# Patient Record
Sex: Female | Born: 1960 | Race: White | Hispanic: No
Health system: Midwestern US, Academic
[De-identification: ages and names within clinical notes are randomized; demographics above are authoritative.]

---

## 2021-07-21 IMAGING — CR SPLUMBLM
3 series · 3 of 3 positions shown · non-contrast
Comparison: none

[lspine lat]
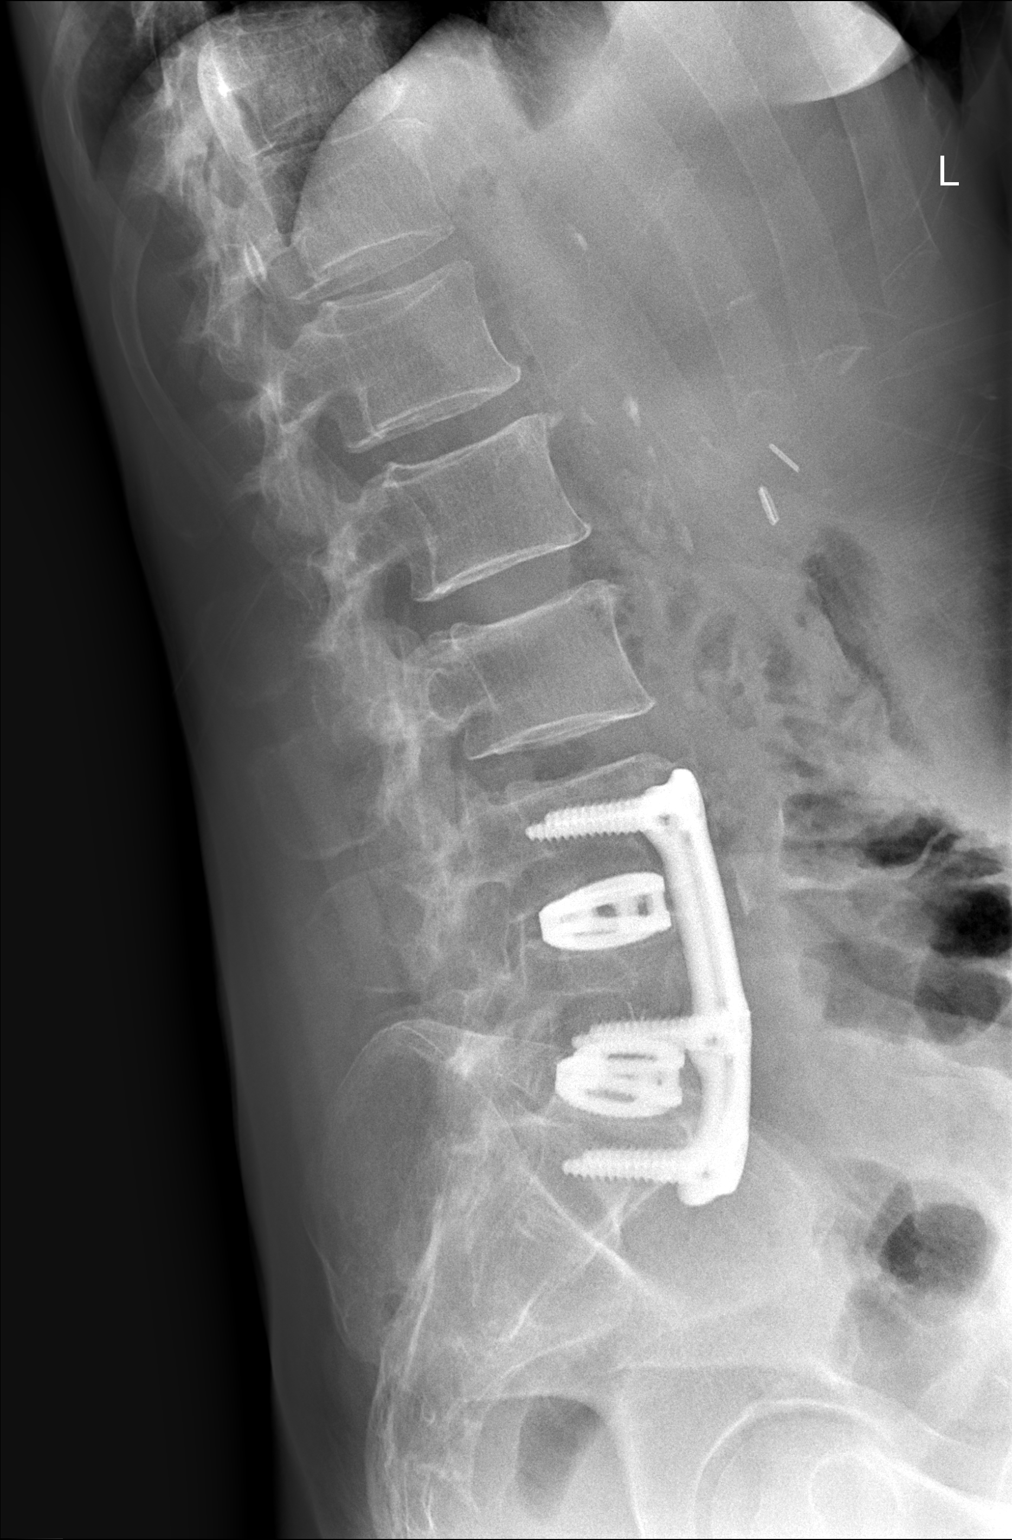

[lspine l5-s1]
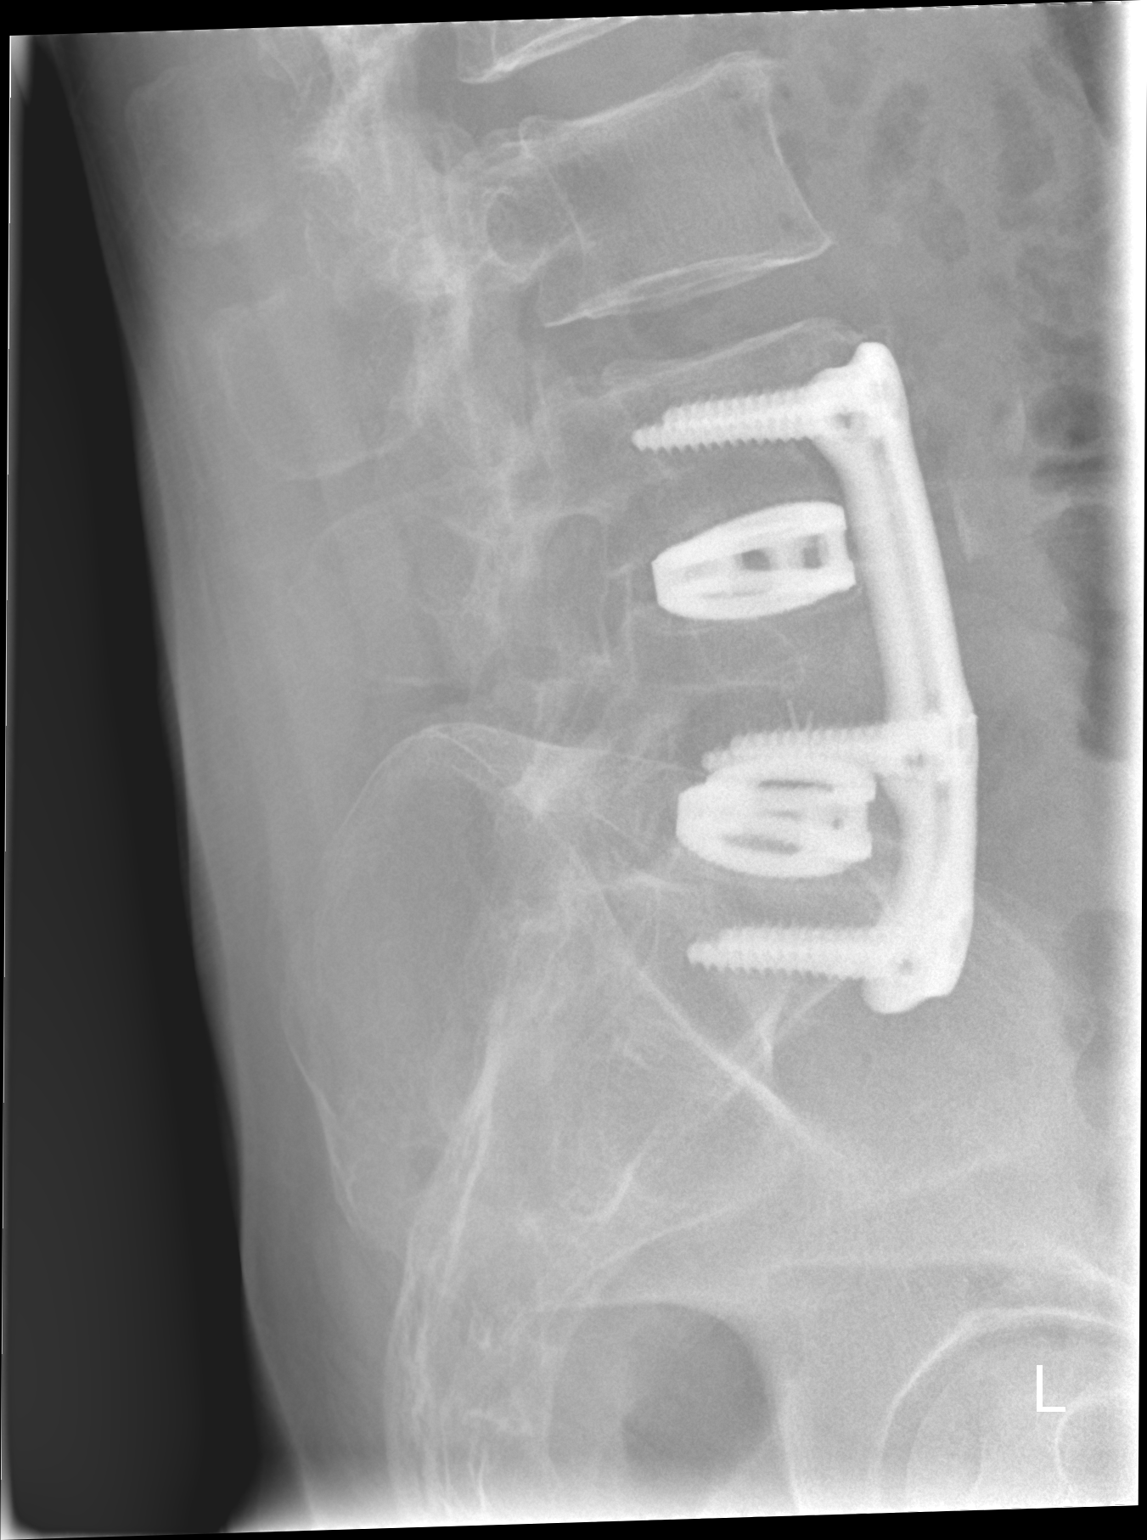

[lspine ap]
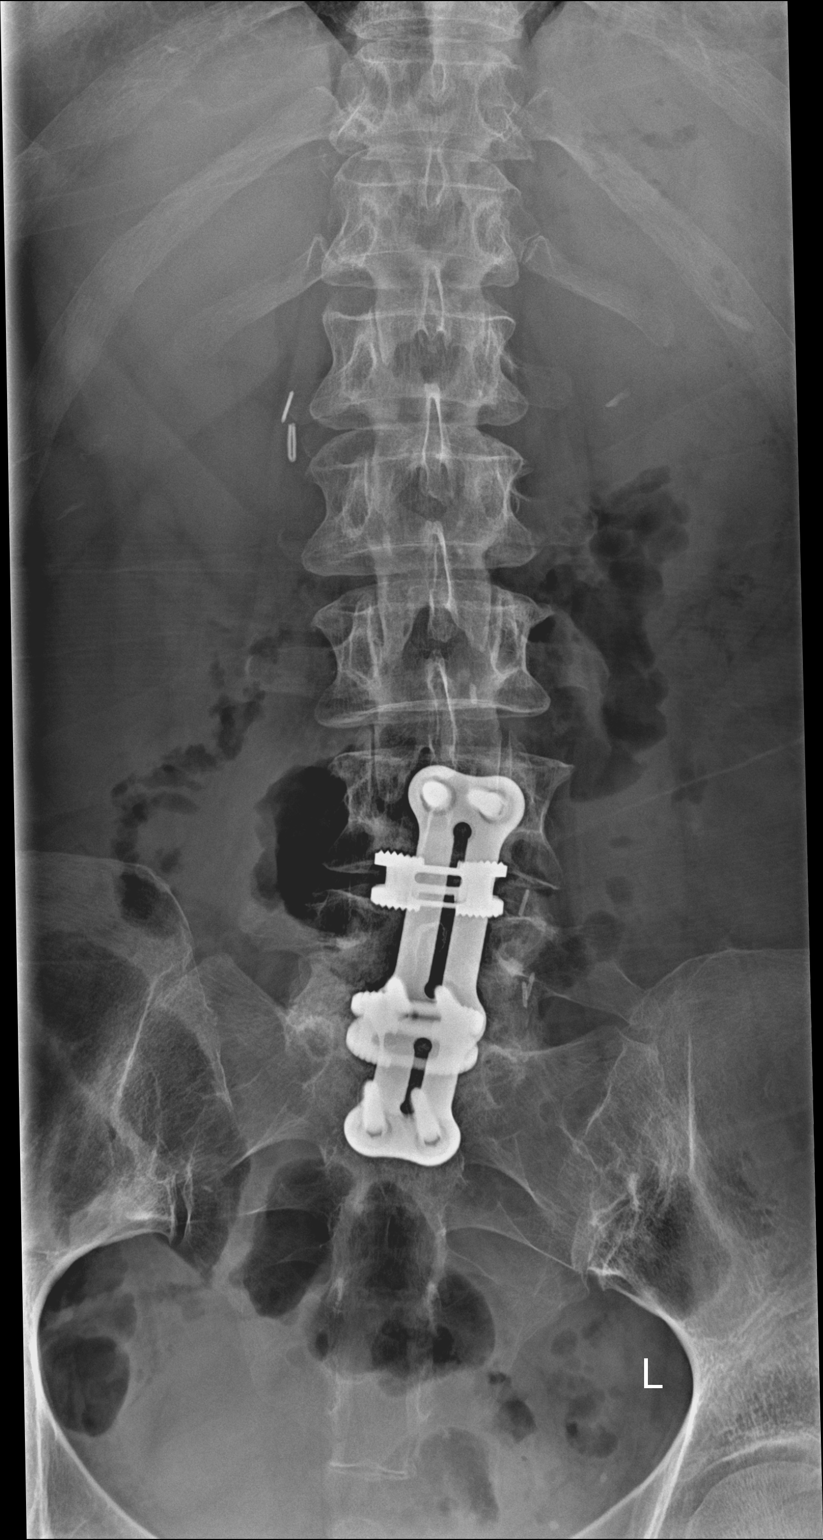

[3 of 3 positions shown; findings below may reference images not displayed]

EXAM

XR lumbar spine 2-3V

INDICATION

exacerbation of chronic back pain
Chronic lower back pain. Hx of back surgery.

TECHNIQUE

Three views of the lumbar spine

COMPARISONS

None available at the time of dictation.

FINDINGS

Anterior fusion hardware placement from L3-S1 with intervertebral disc spacers. Diffusely decreased
bone mineralization. No osseous lucency along the hardware bone interfaces. No perihardware fracture
or dislocation. Suspicion for relatively decreased bony foraminal narrowing at L3-4, L4-5, L5-S1.
Surgical clips in the abdomen.

No endplate compression fracture.

IMPRESSION
1. Postoperative changes as detailed above.
2. Bony foraminal narrowing at L3-4, L4-5, L5-S1.

Tech Notes:

Chronic lower back pain. Hx of back surgery.

## 2021-07-22 ENCOUNTER — Encounter: Admit: 2021-07-22 | Discharge: 2021-07-22

## 2021-07-22 ENCOUNTER — Ambulatory Visit: Admit: 2021-07-22 | Discharge: 2021-07-22

## 2021-07-22 DIAGNOSIS — L0291 Cutaneous abscess, unspecified: Secondary | ICD-10-CM

## 2021-07-22 DIAGNOSIS — M545 Chronic low back pain, unspecified back pain laterality, unspecified whether sciatica present: Secondary | ICD-10-CM

## 2021-07-22 DIAGNOSIS — L02619 Cutaneous abscess of unspecified foot: Secondary | ICD-10-CM

## 2021-07-22 DIAGNOSIS — M549 Dorsalgia, unspecified: Secondary | ICD-10-CM

## 2021-07-22 DIAGNOSIS — A419 Sepsis, unspecified organism: Secondary | ICD-10-CM

## 2021-07-22 IMAGING — CT FOOTLTWW
4 of 8 series · 12 of 33 positions shown, 13 images · non-contrast
Comparison: none

[Series 502: foot or ankle ax 2.00 br60 s3 foot · axial · 0.26mm/px · z∈[+853,+914]mm · 2 of 106 slices shown, 3 images]
[im 36/106  soft-tissue]
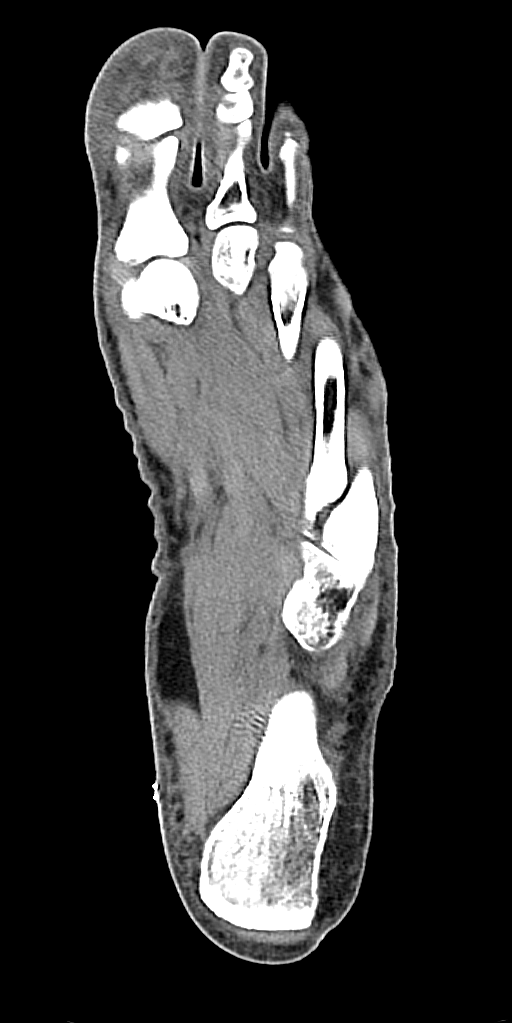
[im 36/106  bone]
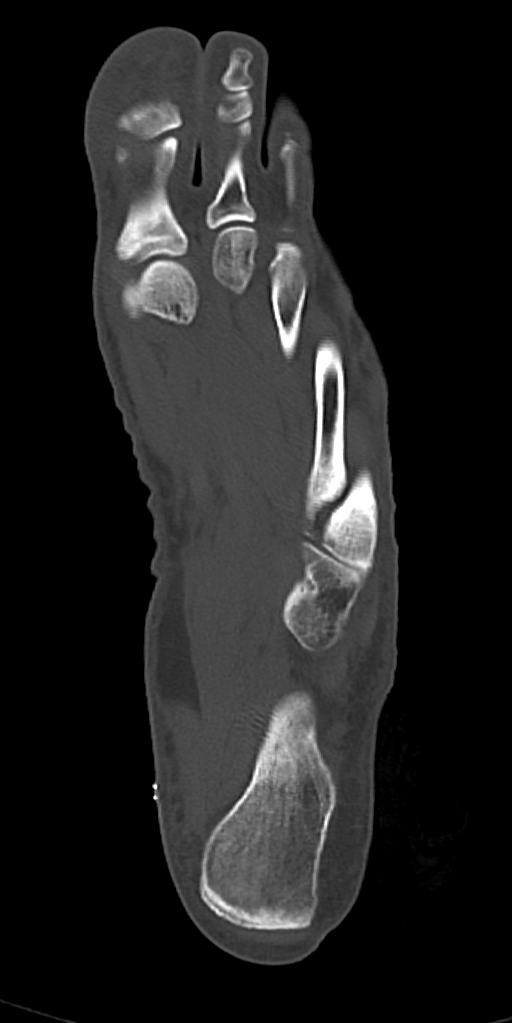
[im 71/106  bone]
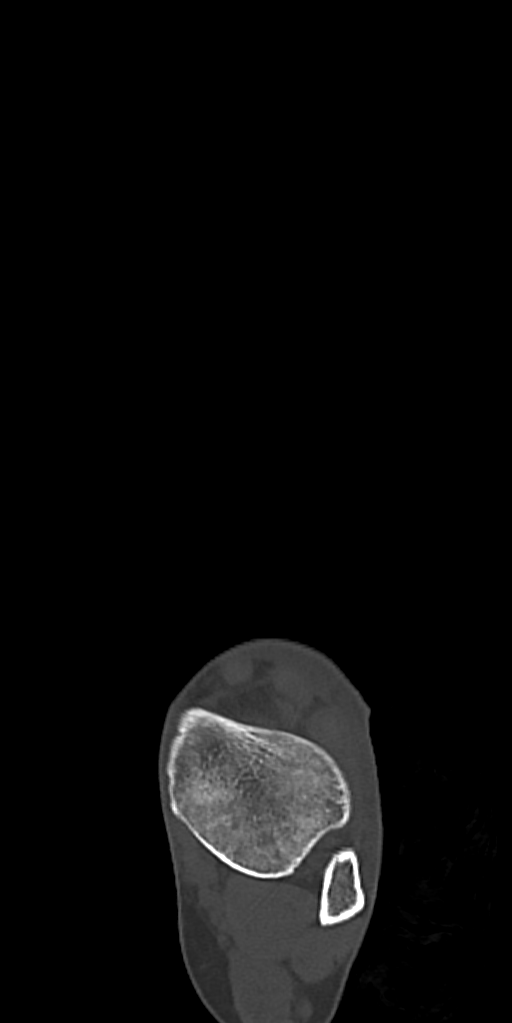

[Series 503: foot or ankle cor 2.00 br60 s3 foot · coronal · 0.26mm/px · 3 of 130 slices shown]
[im 16/130  bone]
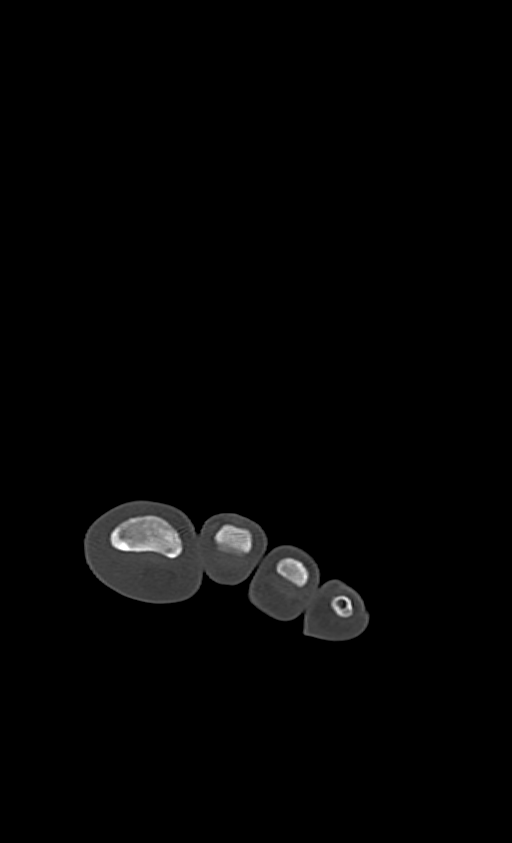
[im 42/130  bone]
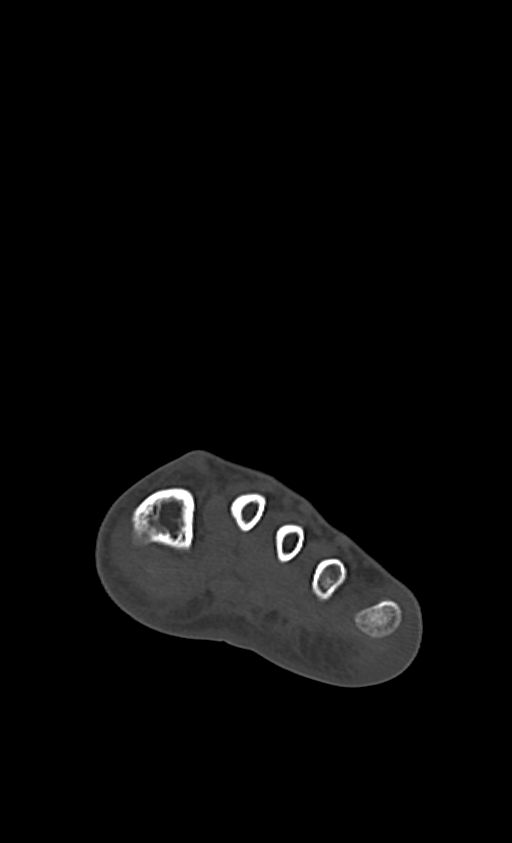
[im 68/130  bone]
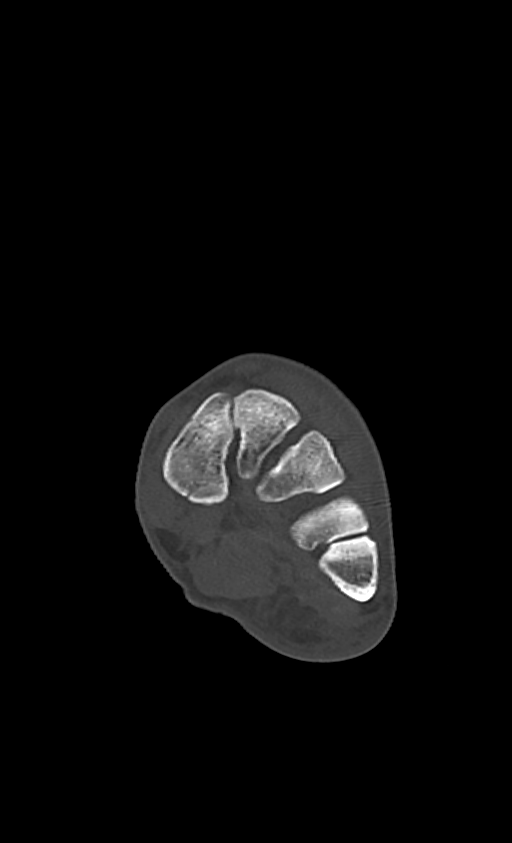

[Series 504: foot or ankle sag 2.00 br60 s3 foot · sagittal · 0.42mm/px · 5 of 65 slices shown]
[im 11/65  bone]
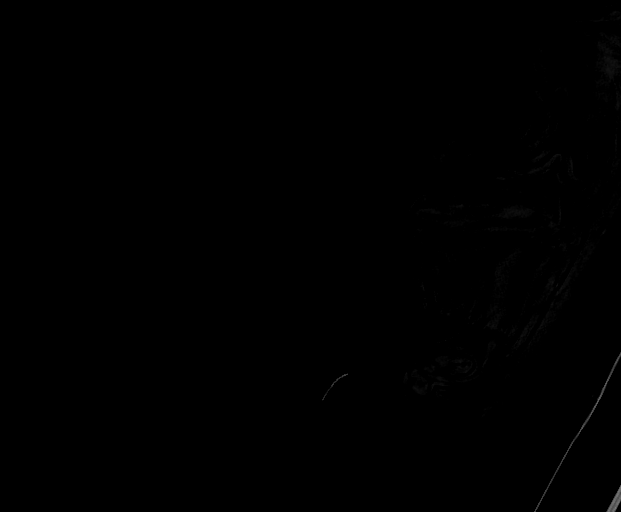
[im 22/65  bone]
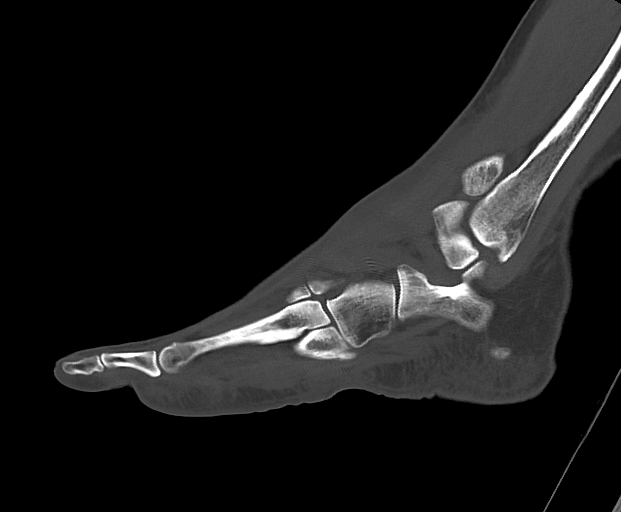
[im 33/65  bone]
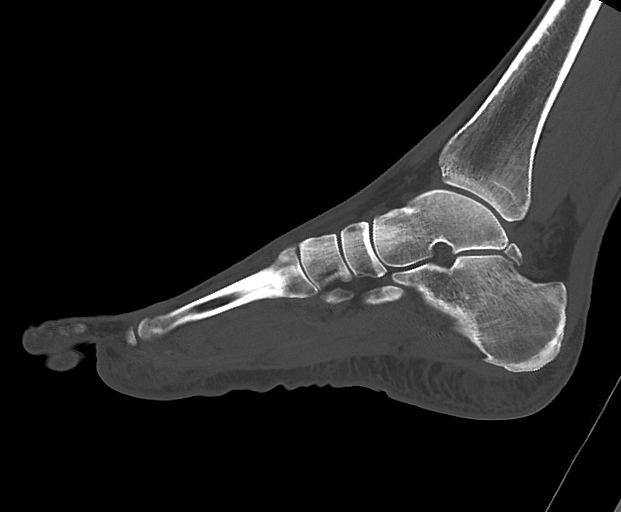
[im 43/65  bone]
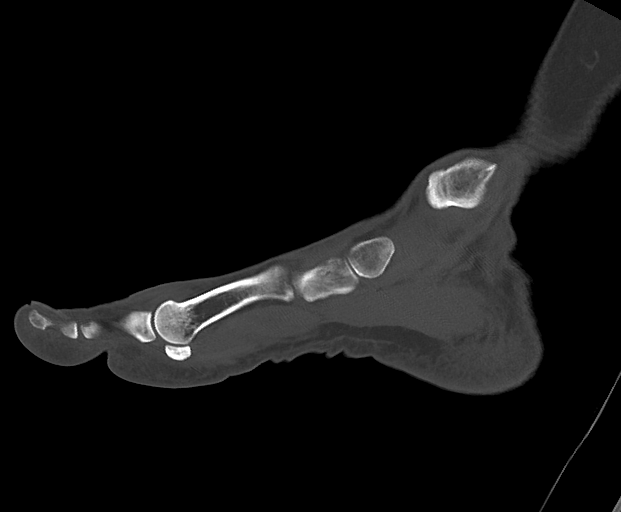
[im 54/65  bone]
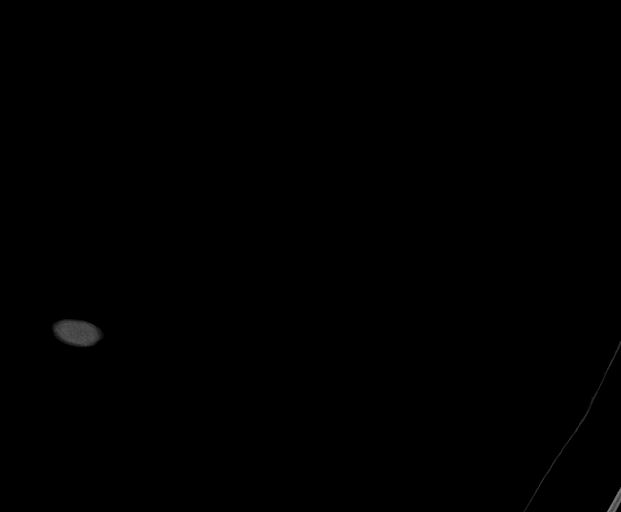

[Series 505: foot or ankle ax 2.00 br40 s3 foot · axial · 0.36mm/px · z∈[+820,+892]mm · 2 of 125 slices shown]
[im 42/125  bone]
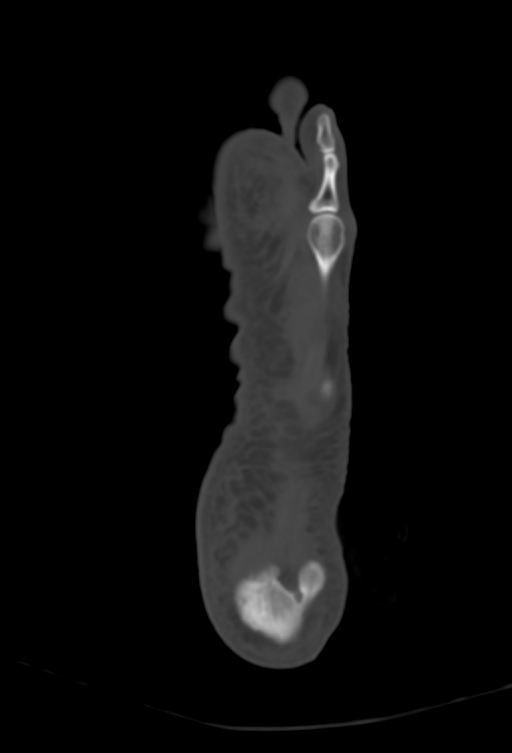
[im 83/125  bone]
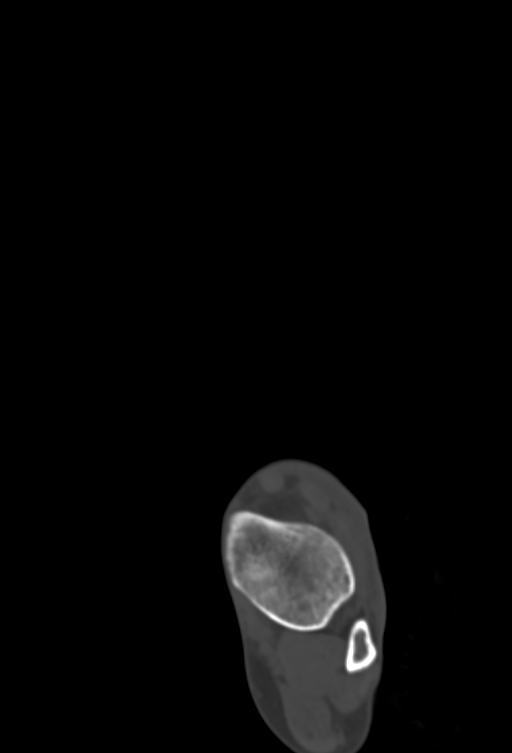

[12 of 33 positions shown; findings below may reference images not displayed]

DIAGNOSTIC STUDIES

EXAM

CT scan left foot and ankle with contrast.

INDICATION

abscess
RULE OUT ABSCESS. SWELLING IN LEFT FOOT AND ANKLE. GFR 83. 3VVB0 [id number]. CT/NM 0/0. TJ/CS

TECHNIQUE

All CT scans at this facility use dose modulation, iterative reconstruction, and/or weight based
dosing when appropriate to reduce radiation dose to as low as reasonably achievable.

Number of previous computed tomography exams in the last 12 months is 0  .

Number of previous nuclear medicine myocardial perfusion studies in the last 12 months is 0  .

COMPARISONS

None available

FINDINGS

There is an elongated septated rim enhancing fluid collection along the anterior lateral proximal
foot and/or distal ankle. This measures approximately 8 cm craniocaudad by 4.4 cm AP and 1 cm in
thickness image 38 series 510.

This lies along the course of the extensor digitorum longus and extends into portions of it
consistent with coexisting myositis.

The underlying osseous structures demonstrate no evidence for erosion or fracture.

IMPRESSION

Large rim enhancing septated fluid collection along the anterior lateral aspect of the proximal
left foot and/or distal ankle consistent with abscess and associated myositis of the extensor
digitorum longus. There is no evidence for osteomyelitis.

Tech Notes:

RULE OUT ABSCESS. SWELLING IN LEFT FOOT AND ANKLE. GFR 83. 3VVB0 [id number]. CT/NM 0/0. TJ/CS

## 2021-07-22 IMAGING — CT ANKLTWW
4 of 8 series · 11 of 34 positions shown, 12 images · non-contrast
Comparison: none

[Series 10: foot or ankle cor 2.00 br60 s3 ankle · coronal · 0.22mm/px · 1 of 69 slices shown]
[im 35/69  bone]
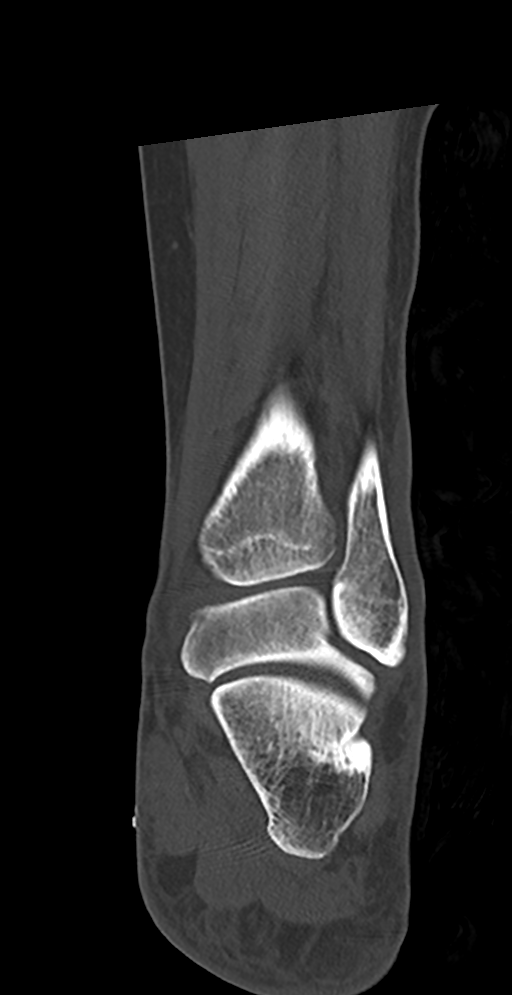

[Series 12: foot or ankle sag 2.00 br60 s3 ankle · sagittal · 0.27mm/px · 6 of 55 slices shown]
[im 10/55  bone]
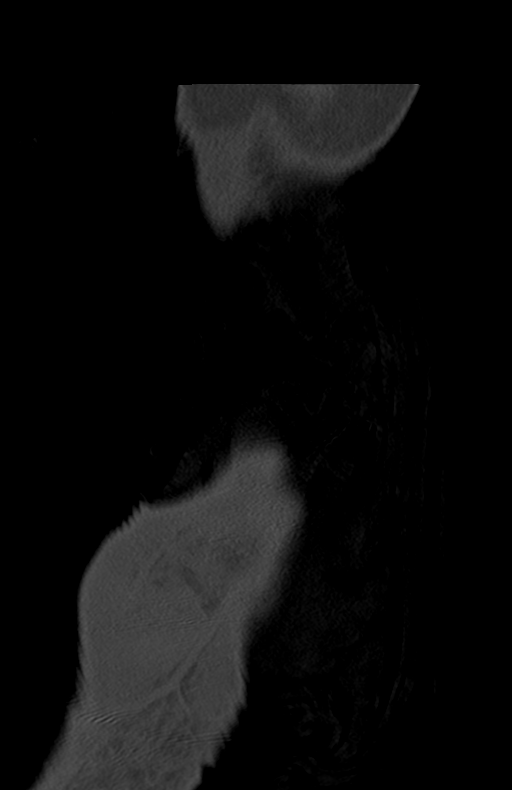
[im 19/55  bone]
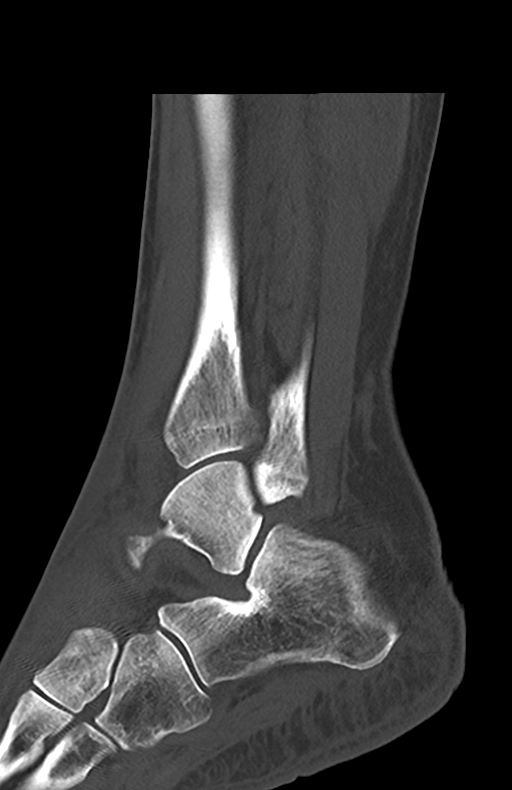
[im 28/55  bone]
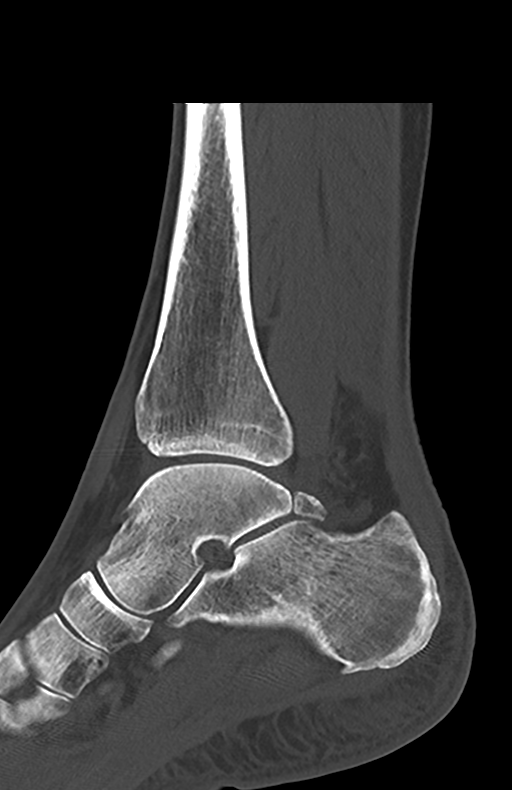
[im 33/55  soft-tissue]
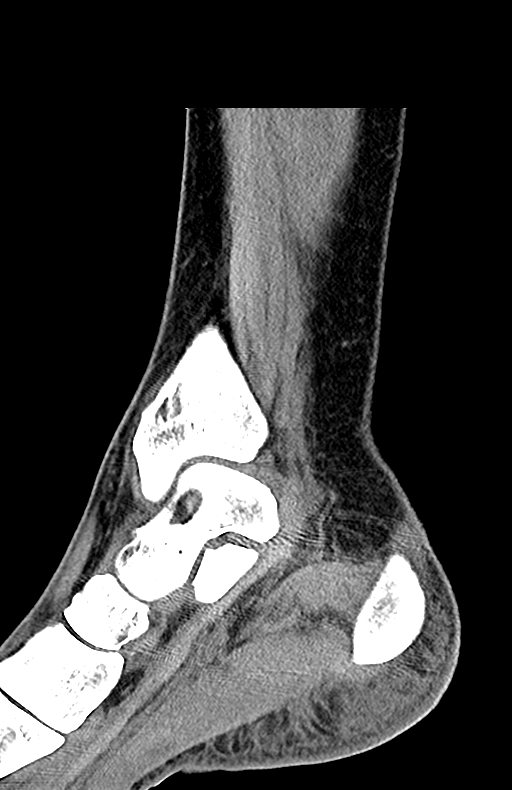
[im 37/55  bone]
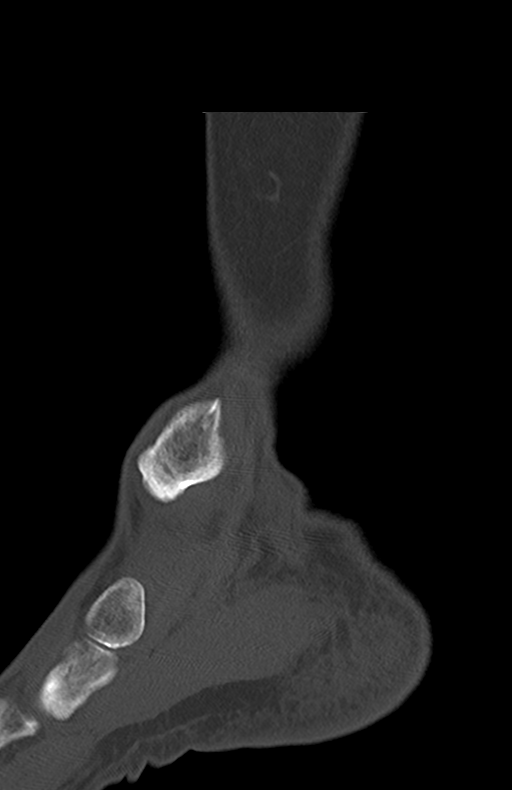
[im 46/55  bone]
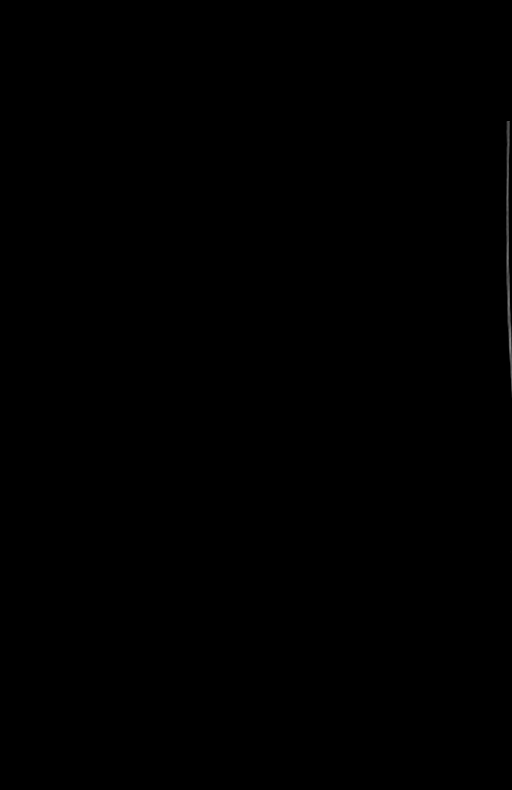

[Series 26: foot or ankle ax 2.00 br60 s3 ankle · axial · 0.21mm/px · z∈[+958,+1029]mm · 2 of 107 slices shown, 3 images]
[im 36/107  soft-tissue]
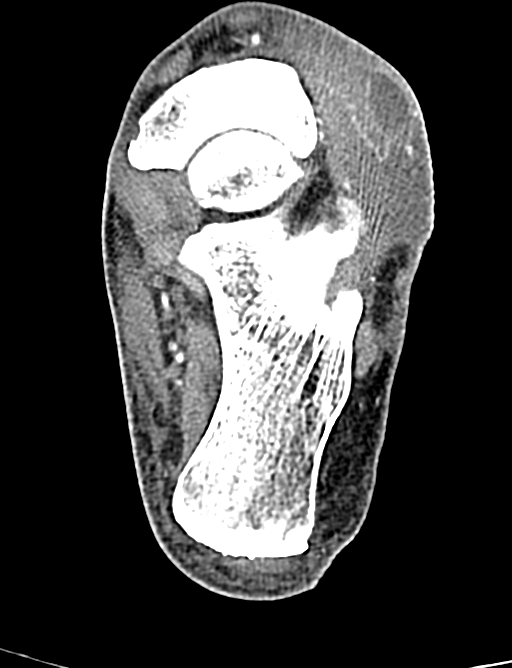
[im 36/107  bone]
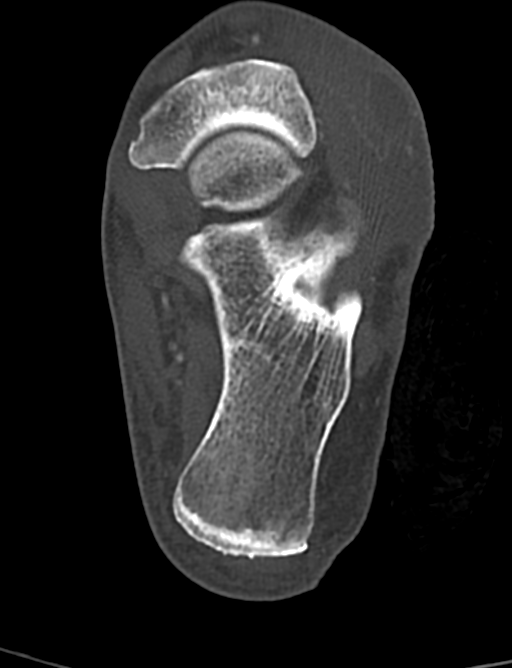
[im 71/107  bone]
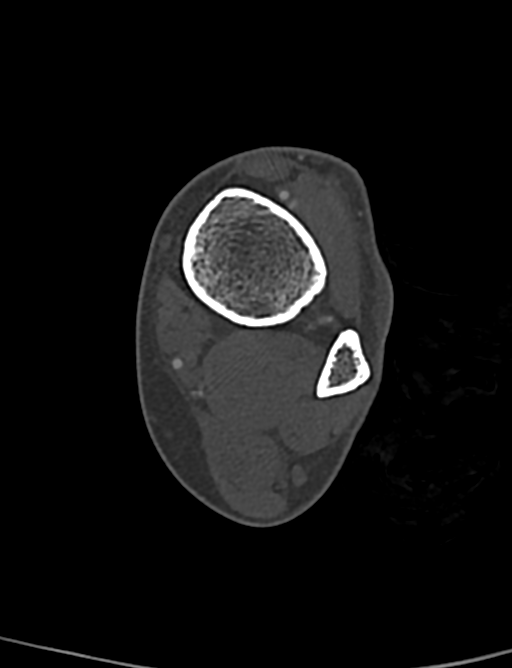

[Series 34: foot or ankle ax 2.00 br40 s3 ankle · axial · 0.21mm/px · z∈[+967,+1036]mm · 2 of 103 slices shown]
[im 35/103  bone]
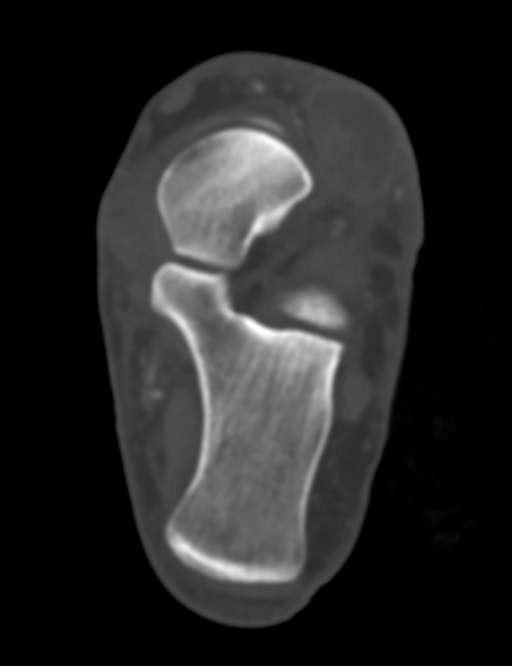
[im 69/103  bone]
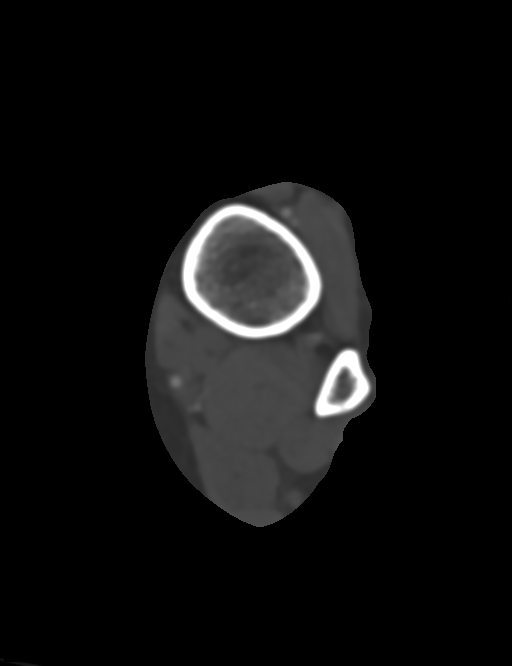

[11 of 34 positions shown; findings below may reference images not displayed]

DIAGNOSTIC STUDIES

EXAM

CT scan left foot and ankle with contrast.

INDICATION

abscess
RULE OUT ABSCESS. SWELLING IN LEFT FOOT AND ANKLE. GFR 83. 3VVB0 [id number]. CT/NM 0/0. TJ/CS

TECHNIQUE

All CT scans at this facility use dose modulation, iterative reconstruction, and/or weight based
dosing when appropriate to reduce radiation dose to as low as reasonably achievable.

Number of previous computed tomography exams in the last 12 months is 0  .

Number of previous nuclear medicine myocardial perfusion studies in the last 12 months is 0  .

COMPARISONS

None available

FINDINGS

There is an elongated septated rim enhancing fluid collection along the anterior lateral proximal
foot and/or distal ankle. This measures approximately 8 cm craniocaudad by 4.4 cm AP and 1 cm in
thickness image 38 series 510.

This lies along the course of the extensor digitorum longus and extends into portions of it
consistent with coexisting myositis.

The underlying osseous structures demonstrate no evidence for erosion or fracture.

IMPRESSION

Large rim enhancing septated fluid collection along the anterior lateral aspect of the proximal
left foot and/or distal ankle consistent with abscess and associated myositis of the extensor
digitorum longus. There is no evidence for osteomyelitis.

Tech Notes:

RULE OUT ABSCESS. SWELLING IN LEFT FOOT AND ANKLE. GFR 83. 3VVB0 [id number]. CT/NM 0/0. TJ/CS

## 2021-07-22 IMAGING — US ECHOCOMPL
1 series · 12 of 24 positions shown · non-contrast
Comparison: none

[Series 1: us echo 2d, complete · 20 acquisitions, 12 frames shown]
[im 1/20]
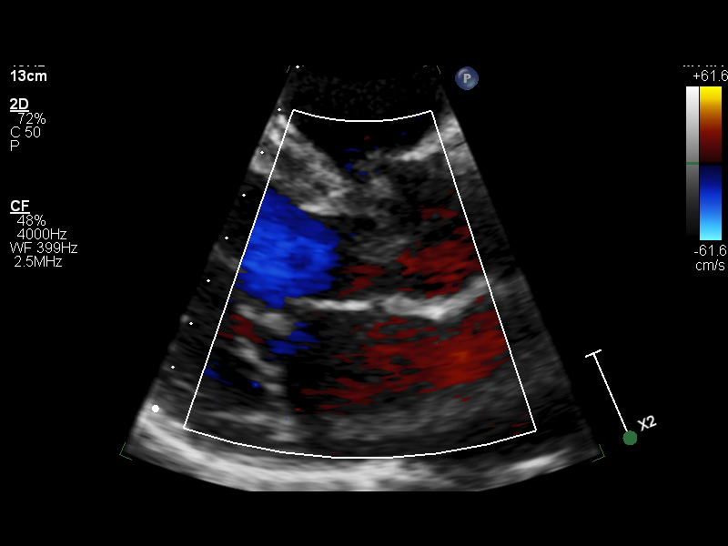
[im 4/20]
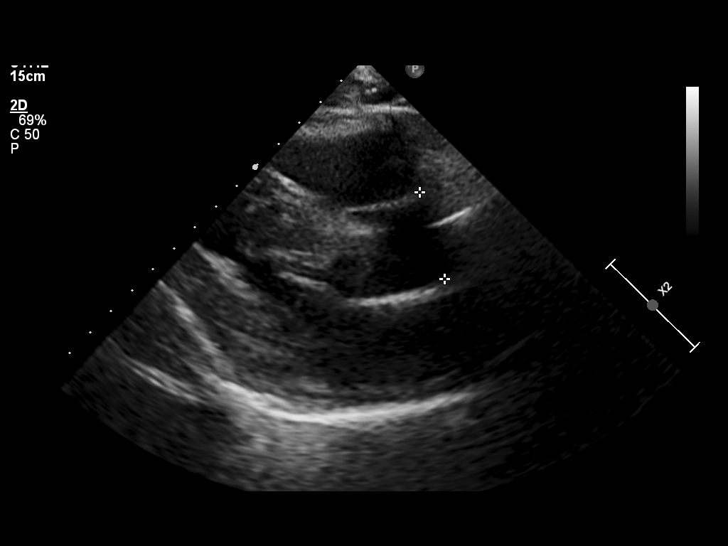
[im 5/20]
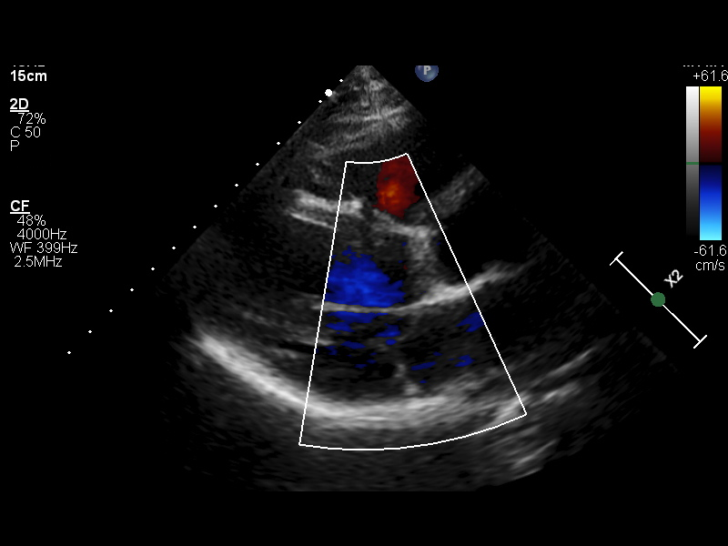
[im 7/20]
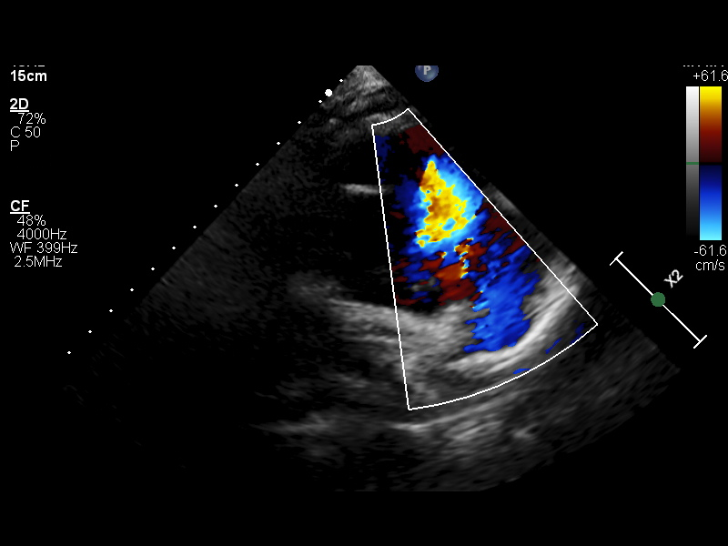
[im 8/20]
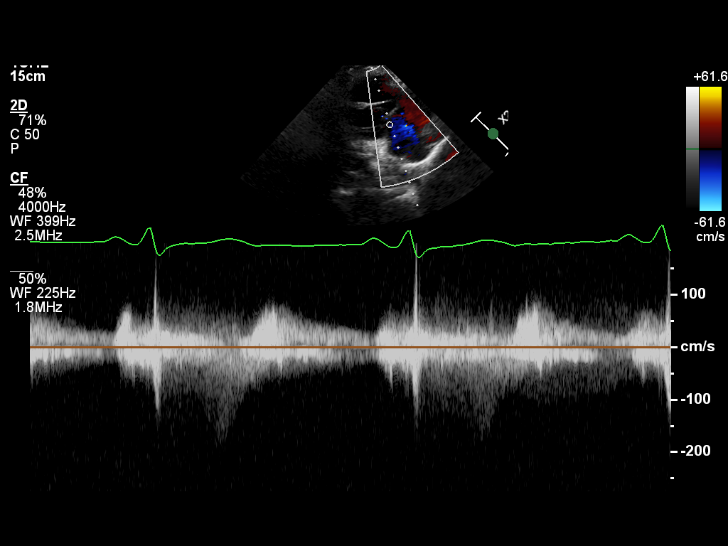
[im 9/20]
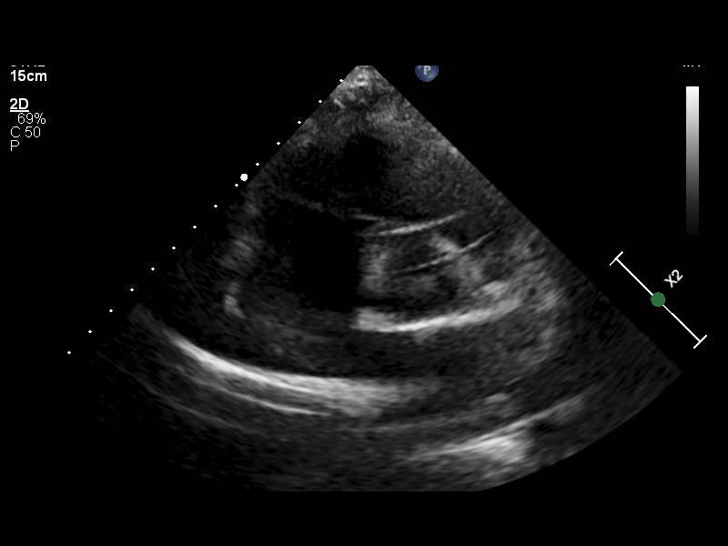
[im 11/20]
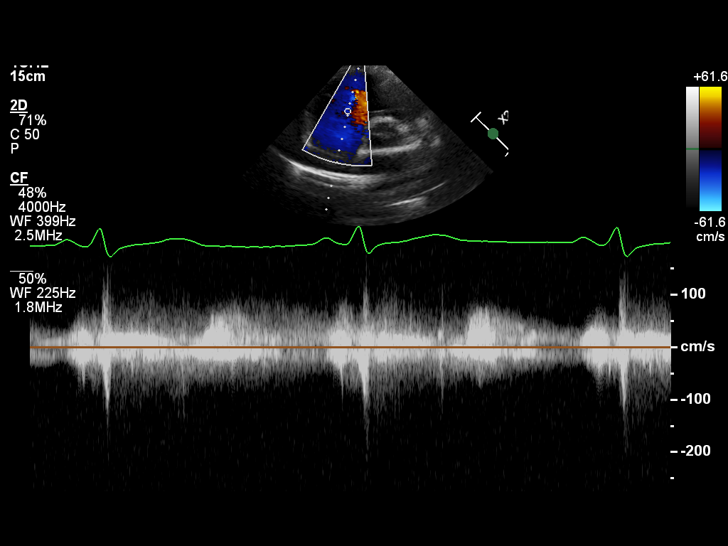
[im 12/20]
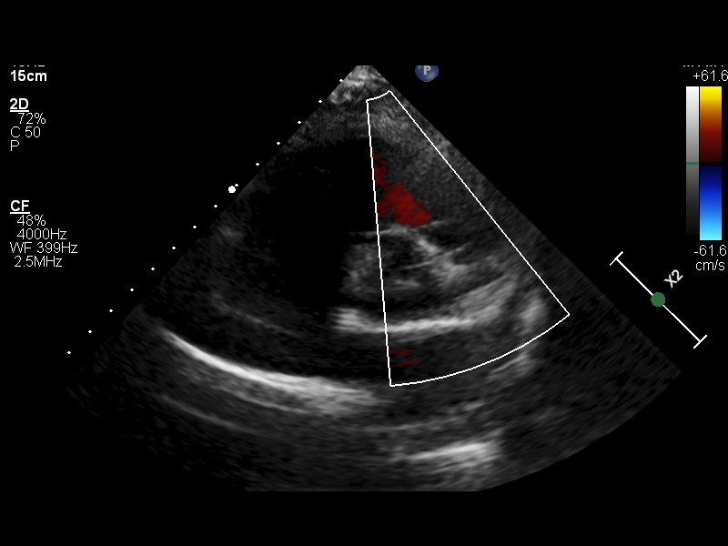
[im 15/20]
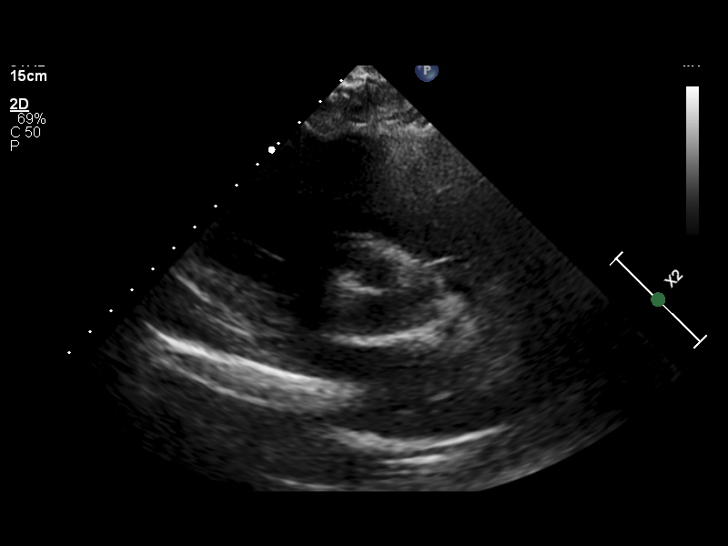
[im 16/20]
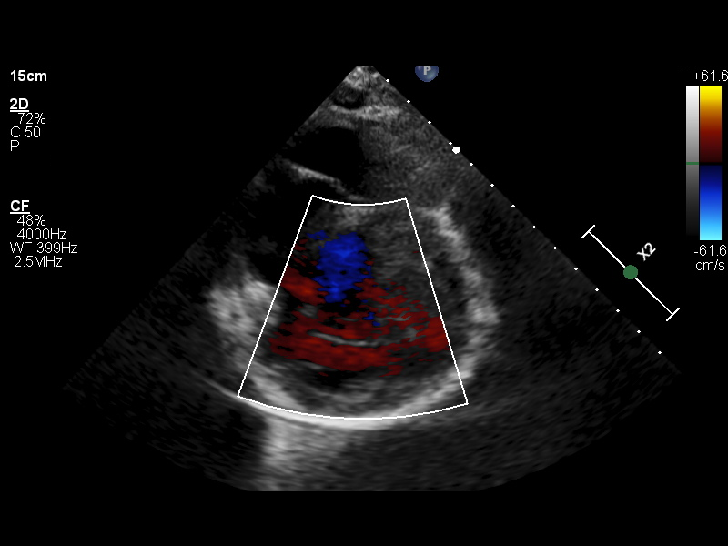
[im 18/20]
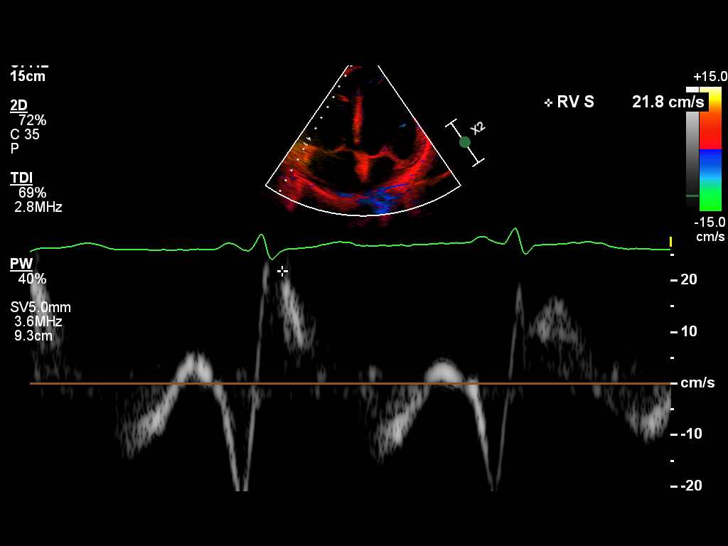
[im 20/20]
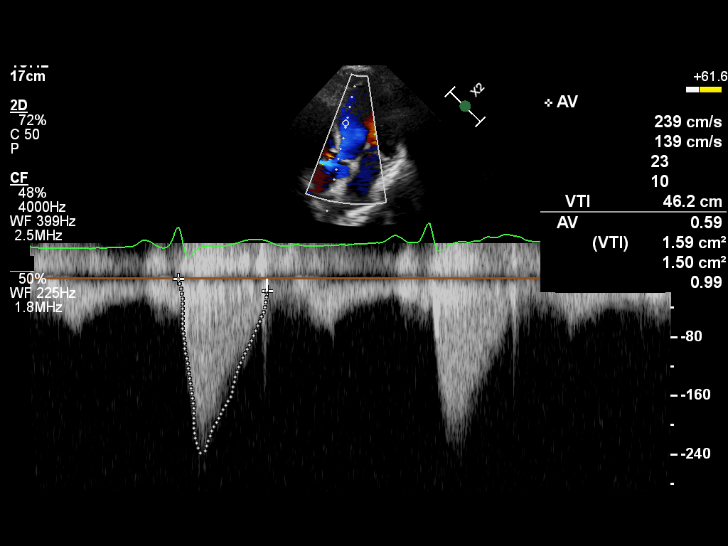

[12 of 24 positions shown; findings below may reference images not displayed]

Bodych, Artur Arczi; Lazo, Davinder

07/22/21 -  2D + DOPPLER ECHO
Location Performed: [HOSPITAL]

Referring Provider:
Interpreting Physician: Andujar, David
Frayre: Choi Domino, DO
Location of Interp:
Sonographer: External Staff

[facility]

Indications: Endocarditis          Suspected IVDU

Vitals
Height   Weight   BSA (Calculated)   BP   Comments
165.1 cm (5' 5")   56.2 kg (124 lb)   1.61   109/56

Interpretation Summary
Normal left ventricular size with mild left ventricular concentric hypertrophy.  Normal left
ventricular systolic function with an estimated ejection fraction of 60 to 65%.  No wall motion
abnormalities.  Normal diastolic function.
The right ventricle is mildly dilated with preserved systolic function.
Moderately dilated left atrium.  Normal right atrial size.
There is mild tricuspid regurgitation.  No other hemodynamically significant valvular abnormalities.
No clear evidence of endocarditis seen within the limitations of this study.  Consider further
imaging if clinically indicated.
Estimated central venous pressure is 0 to 5 mmHg.  Estimated pulmonary arterial systolic pressure is
No pericardial effusion

No prior studies available for comparison.

Echocardiographic Findings
Left Ventricle   The left ventricular size is normal. Mild concentric hypertrophy. The left
ventricular systolic function is normal. The ejection fraction by Simpson's biplane method is 64%.
Normal left ventricular diastolic function. Normal left atrial pressure.
Right Ventricle   The right ventricle is mildly dilated. The right ventricular systolic function is
normal.
Left Atrium   Moderately dilated.
Right Atrium   Normal size.
IVC/SVC   Normal central venous pressure (0-5 mm Hg).
Mitral Valve   Normal valve structure. No stenosis. Trace regurgitation.
Tricuspid Valve   Normal valve structure. No stenosis. Mild regurgitation.
Aortic Valve   The valve has focal thickening. No stenosis. No regurgitation.
Pulmonary   The pulmonic valve was not seen well but no Doppler evidence of stenosis.
Aorta   The aortic root and ascending aorta are normal in size.
Pericardium   No pericardial effusion.

Left Heart 2D Measurements (Normal Ranges)
EF (Simpson's)
64 %
LVIDD
5.0 cm  (Range: 3.8 - 5.2)
LVIDS
2.5 cm  (Range: 2.2 - 3.5)
IVS
1.0 cm  (Range: 0.6 - 0.9)
LV PW
1.1 cm  (Range: 0.6 - 0.9)
LA Size
3.5 cm  (Range: 2.7 - 3.8)

Right Heart 2D   M-Mode Measurements (Normal Ranges) (Range)
RV Basal Dia
4.2 cm  (2.5 - 4.1)
RV Mid Dia
3.4 cm  (1.9 - 3.5)
THOLL
14.0 cm2  (<18)
M-Mode TAPSE
3.4 cm  (>1.7)

Left Heart 2D Addnl Measurements (Normal Ranges)
LV Systolic Vol
39 mL  (Range: 14 - 42)
LV Systolic Vol Index
24 mL  (Range: 8 - 24)
LV Diastolic Vol
107 mL  (Range: 46 - 106)
LV Diastolic Vol Index
66 mL  (Range: 29 - 61)
LA Vol
68 mL  (Range: 22 - 52)
LA Vol Index
42.24  (Range: 16 - 34)
LV Mass
194 g  (Range: 67 - 162)
LV Mass Index
121 g/m2  (Range: 43 - 95)
RWT
0.44  (Range: <=0.42)

Aortic Root Measurements (Normal Ranges)
Sinus
3.4 cm  (Range: 2.4 - 3.6)
GERINDA MINDERHOUD
3.0 cm

Doppler (Spectral and Color Flow)
Estimated Peak Systolic PA Pressure
Aortic valve area
1.51 cm2
Aortic valve mean gradient
Aortic valve peak gradient
Aortic valve peak velocity
2.4 m/s
Aortic valve velocity ratio

Teaching Physician Attestation
Study interpreted in conjunction with cardiology fellow Dr. Chan Kuy Youmgsuk

Tech Notes:

## 2021-07-30 IMAGING — CR TOESRT
3 series · 3 of 3 positions shown · non-contrast
Comparison: none

[toes ap]
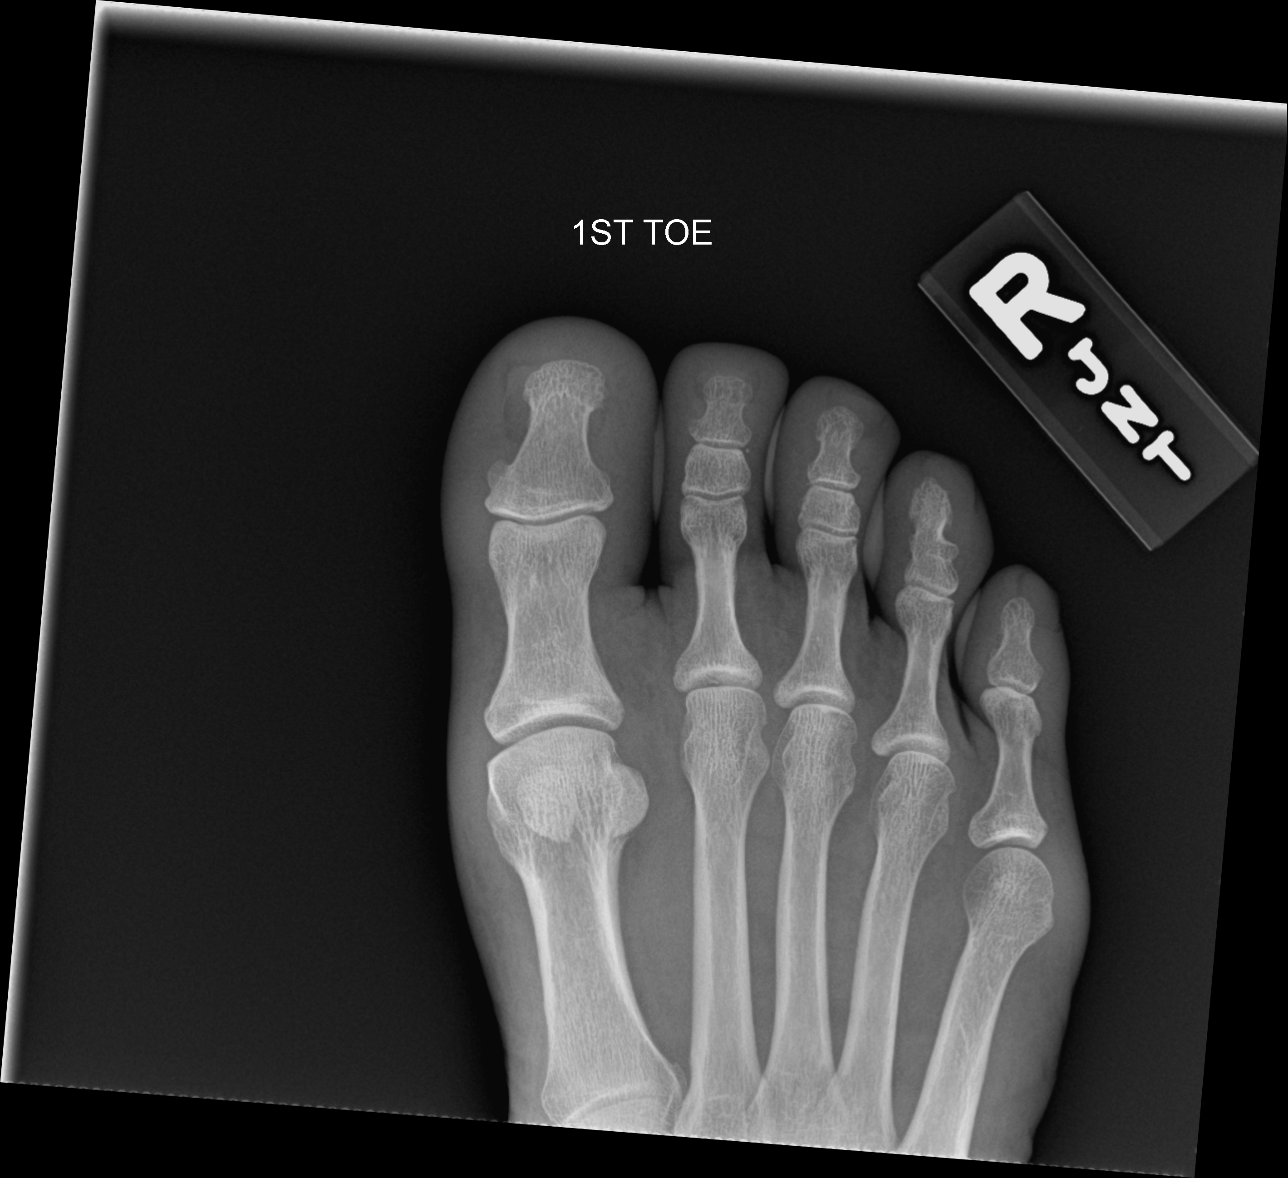

[toes lat]
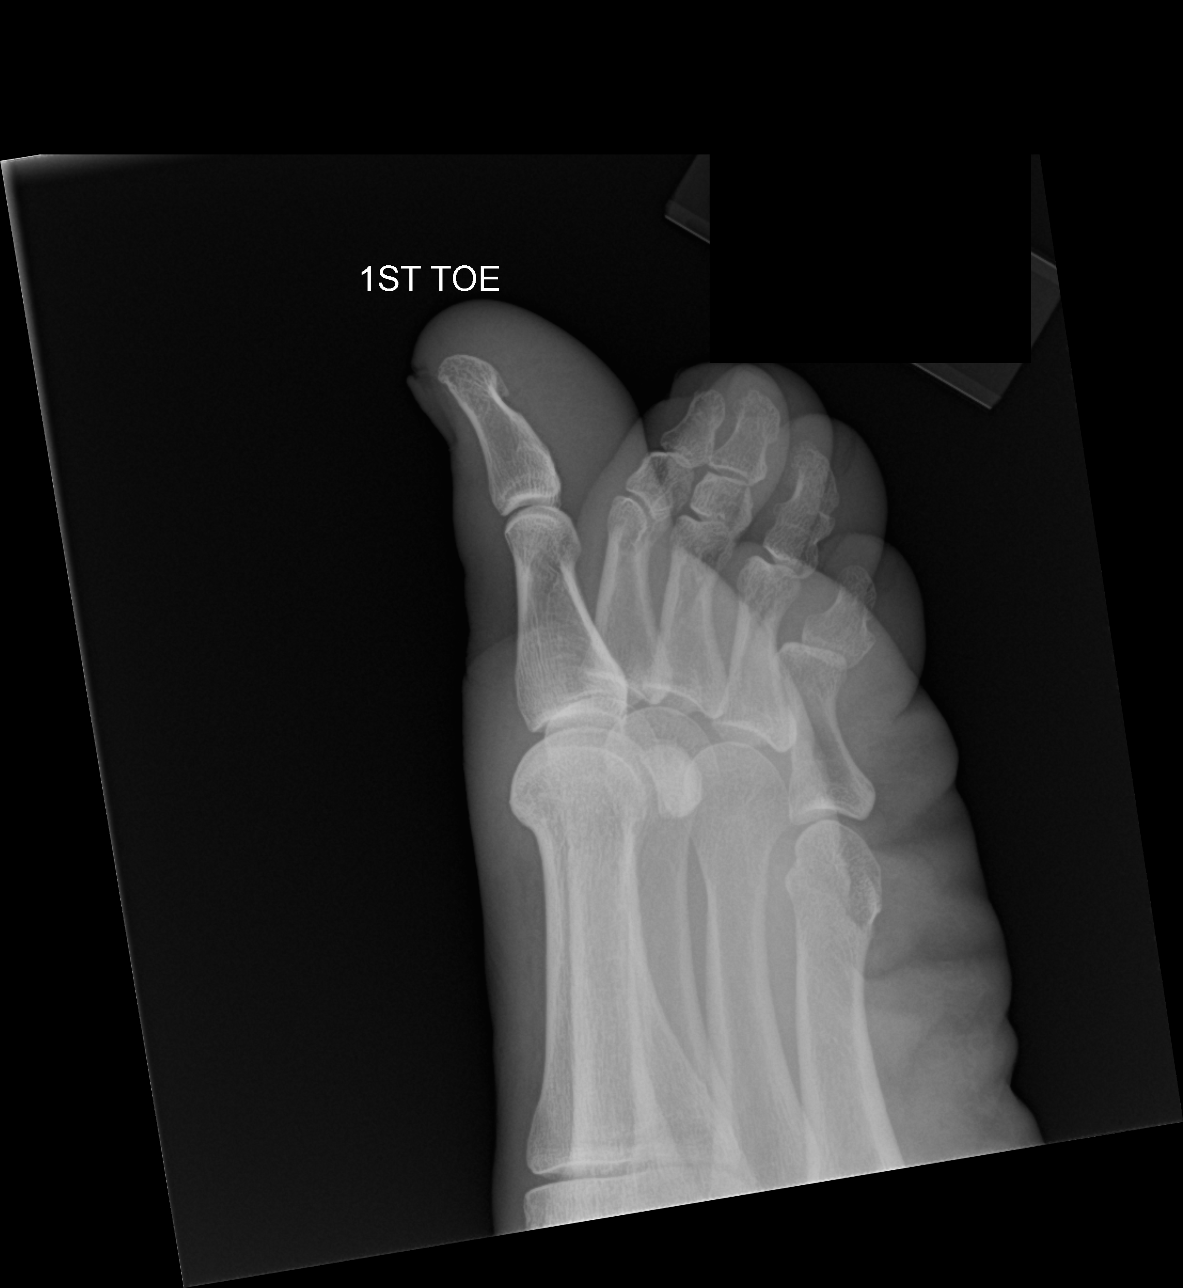

[toes obl]
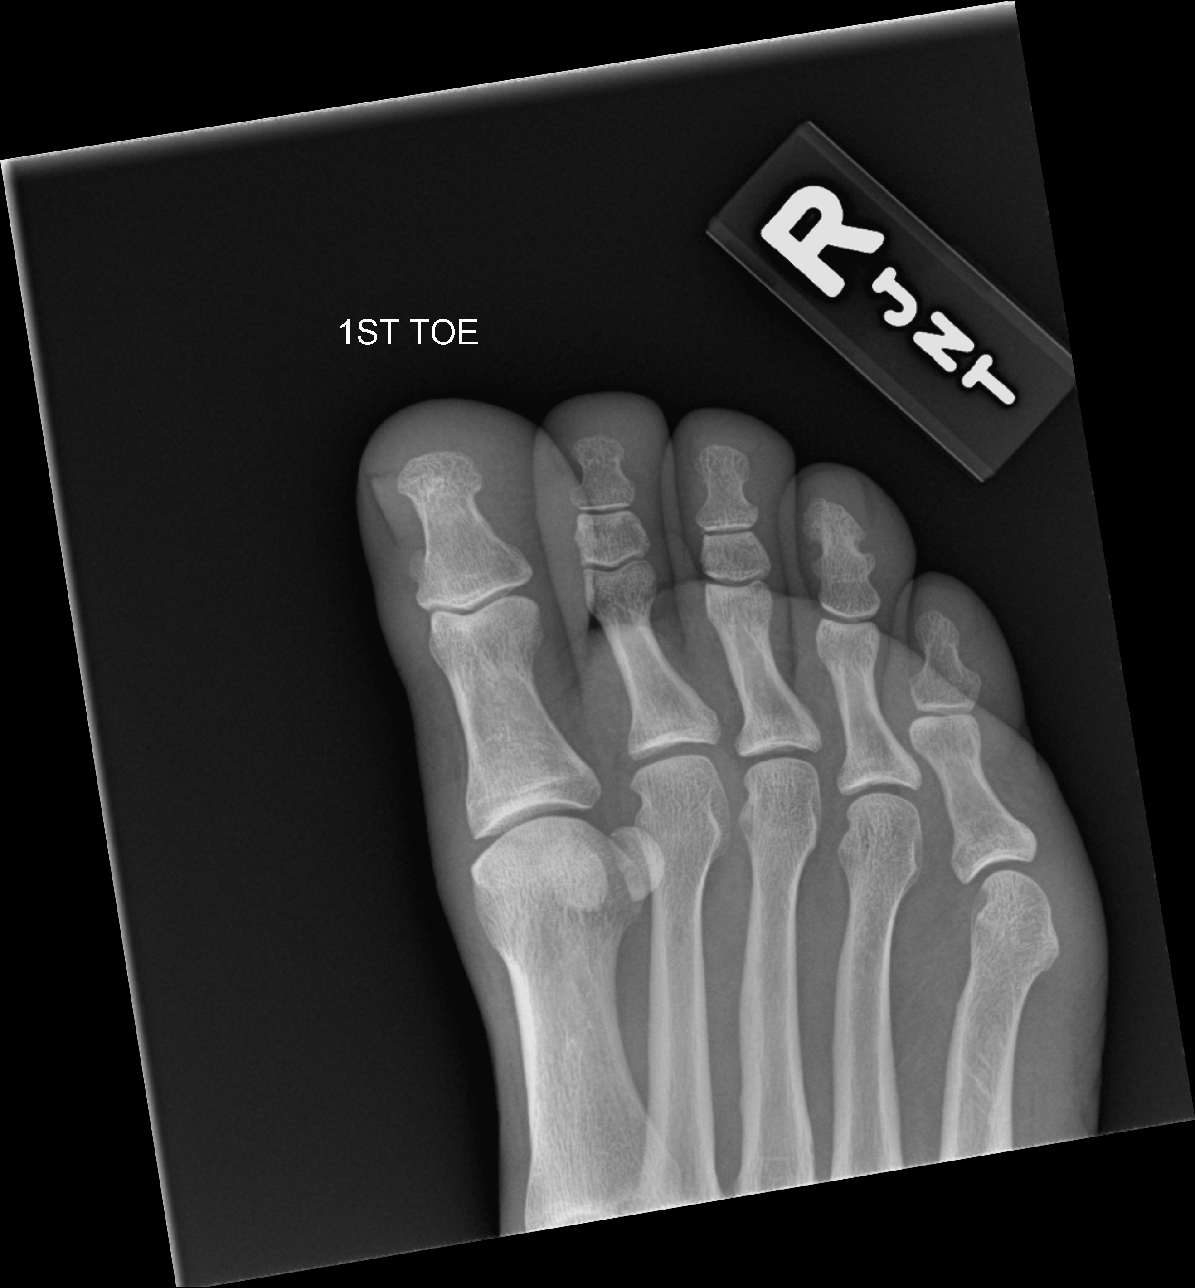

[3 of 3 positions shown; findings below may reference images not displayed]

DIAGNOSTIC STUDIES

EXAM

XR toe RT

INDICATION

right great toe pain
Pt states rt great toe pain x 2 weeks. JT/CS

TECHNIQUE

AP lateral and oblique views

COMPARISONS

None available

FINDINGS

No fractures or dislocations are seen. Joint spaces are well maintained.

IMPRESSION

Normal 1st toe.

Tech Notes:

Pt states rt great toe pain x 2 weeks. JT/CS

## 2021-08-05 IMAGING — CR HIPCMRT
3 series · 3 of 3 positions shown · non-contrast
Comparison: none

[hip ap pelvis]
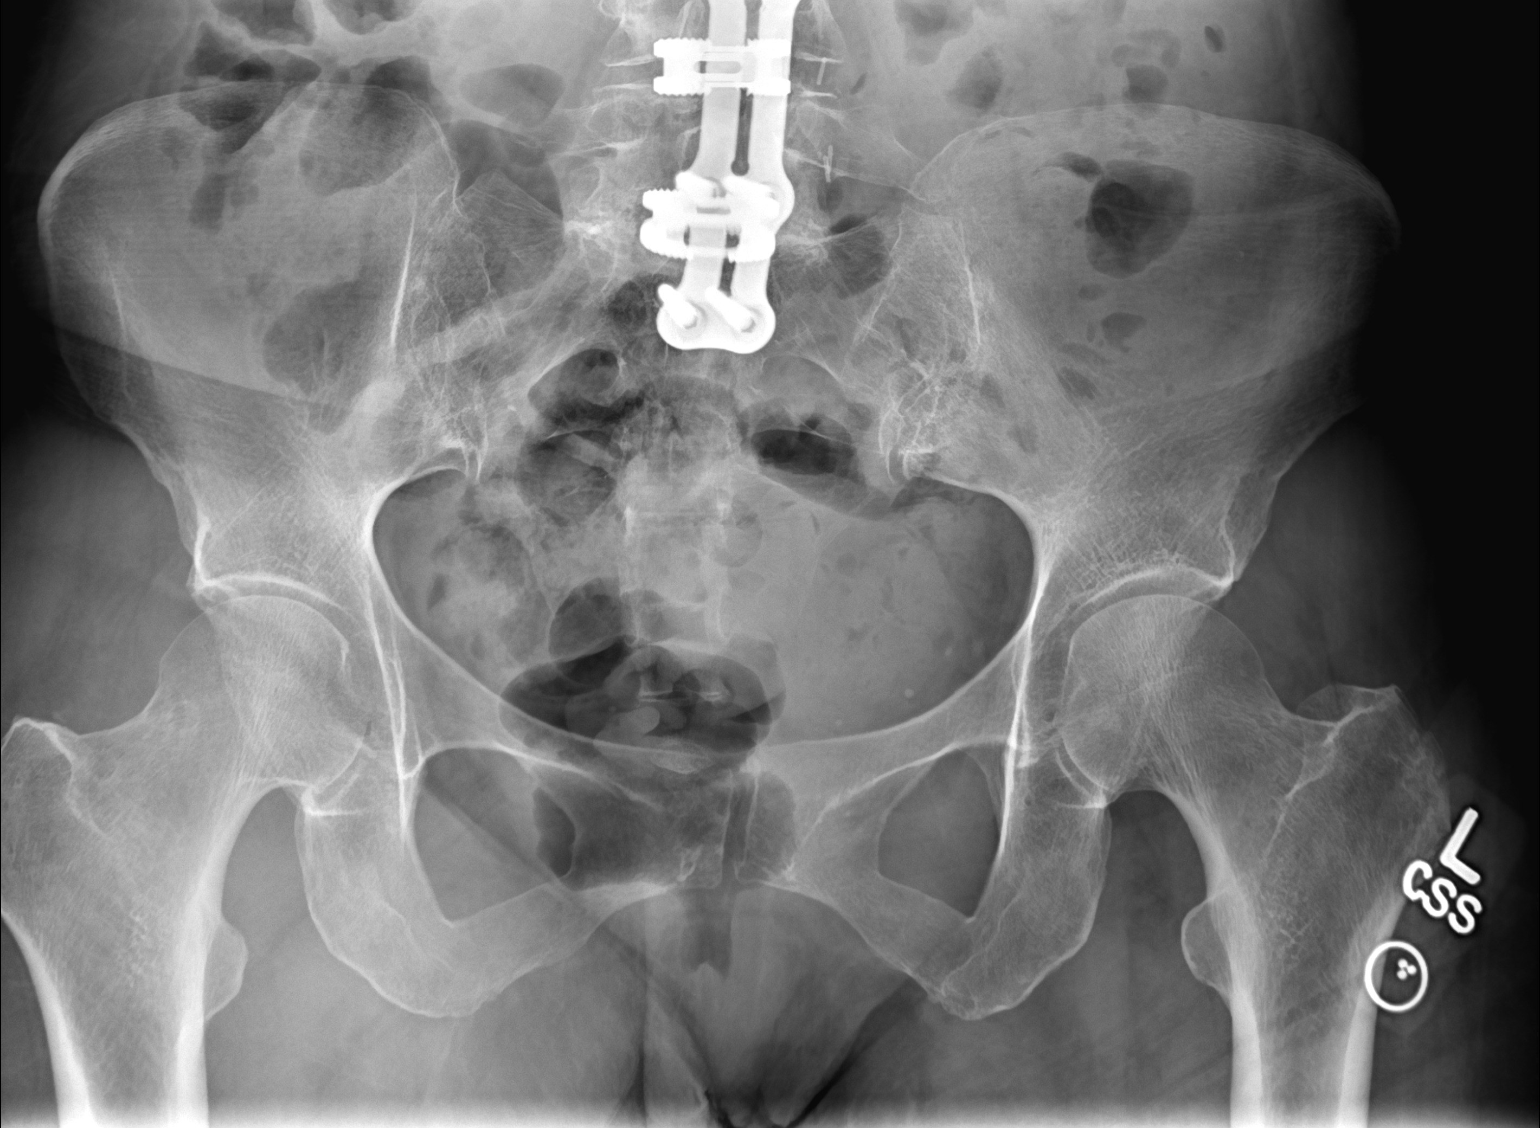

[hip ap]
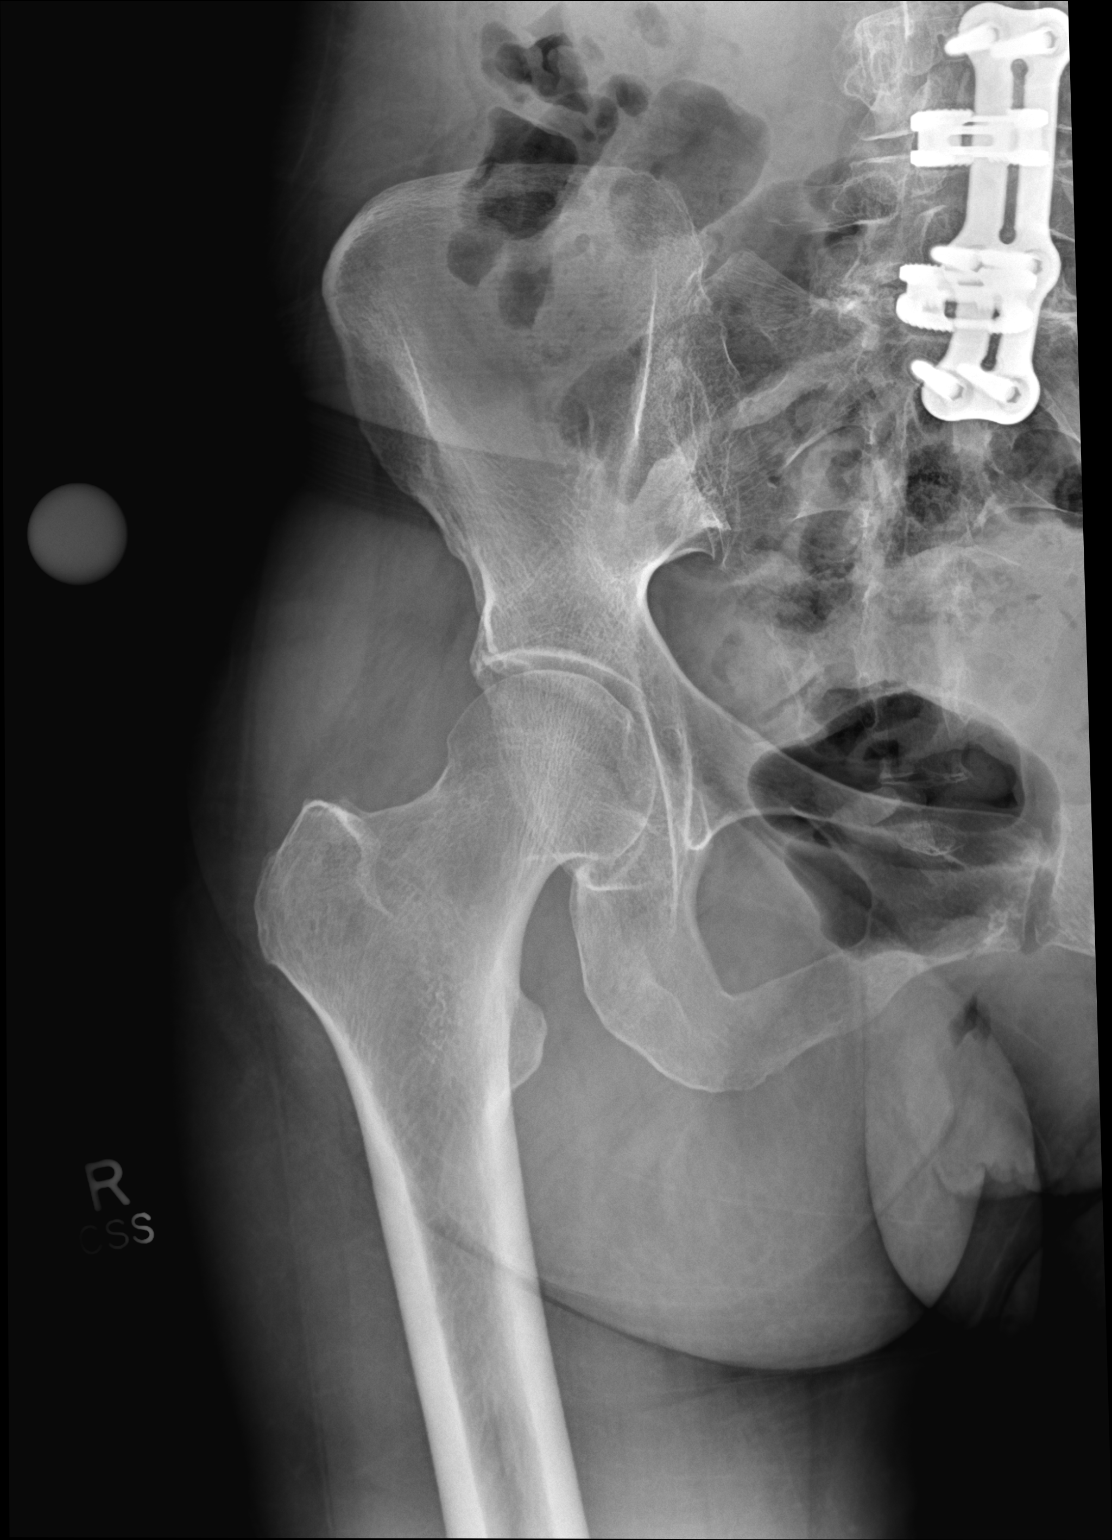

[hip frog lat]
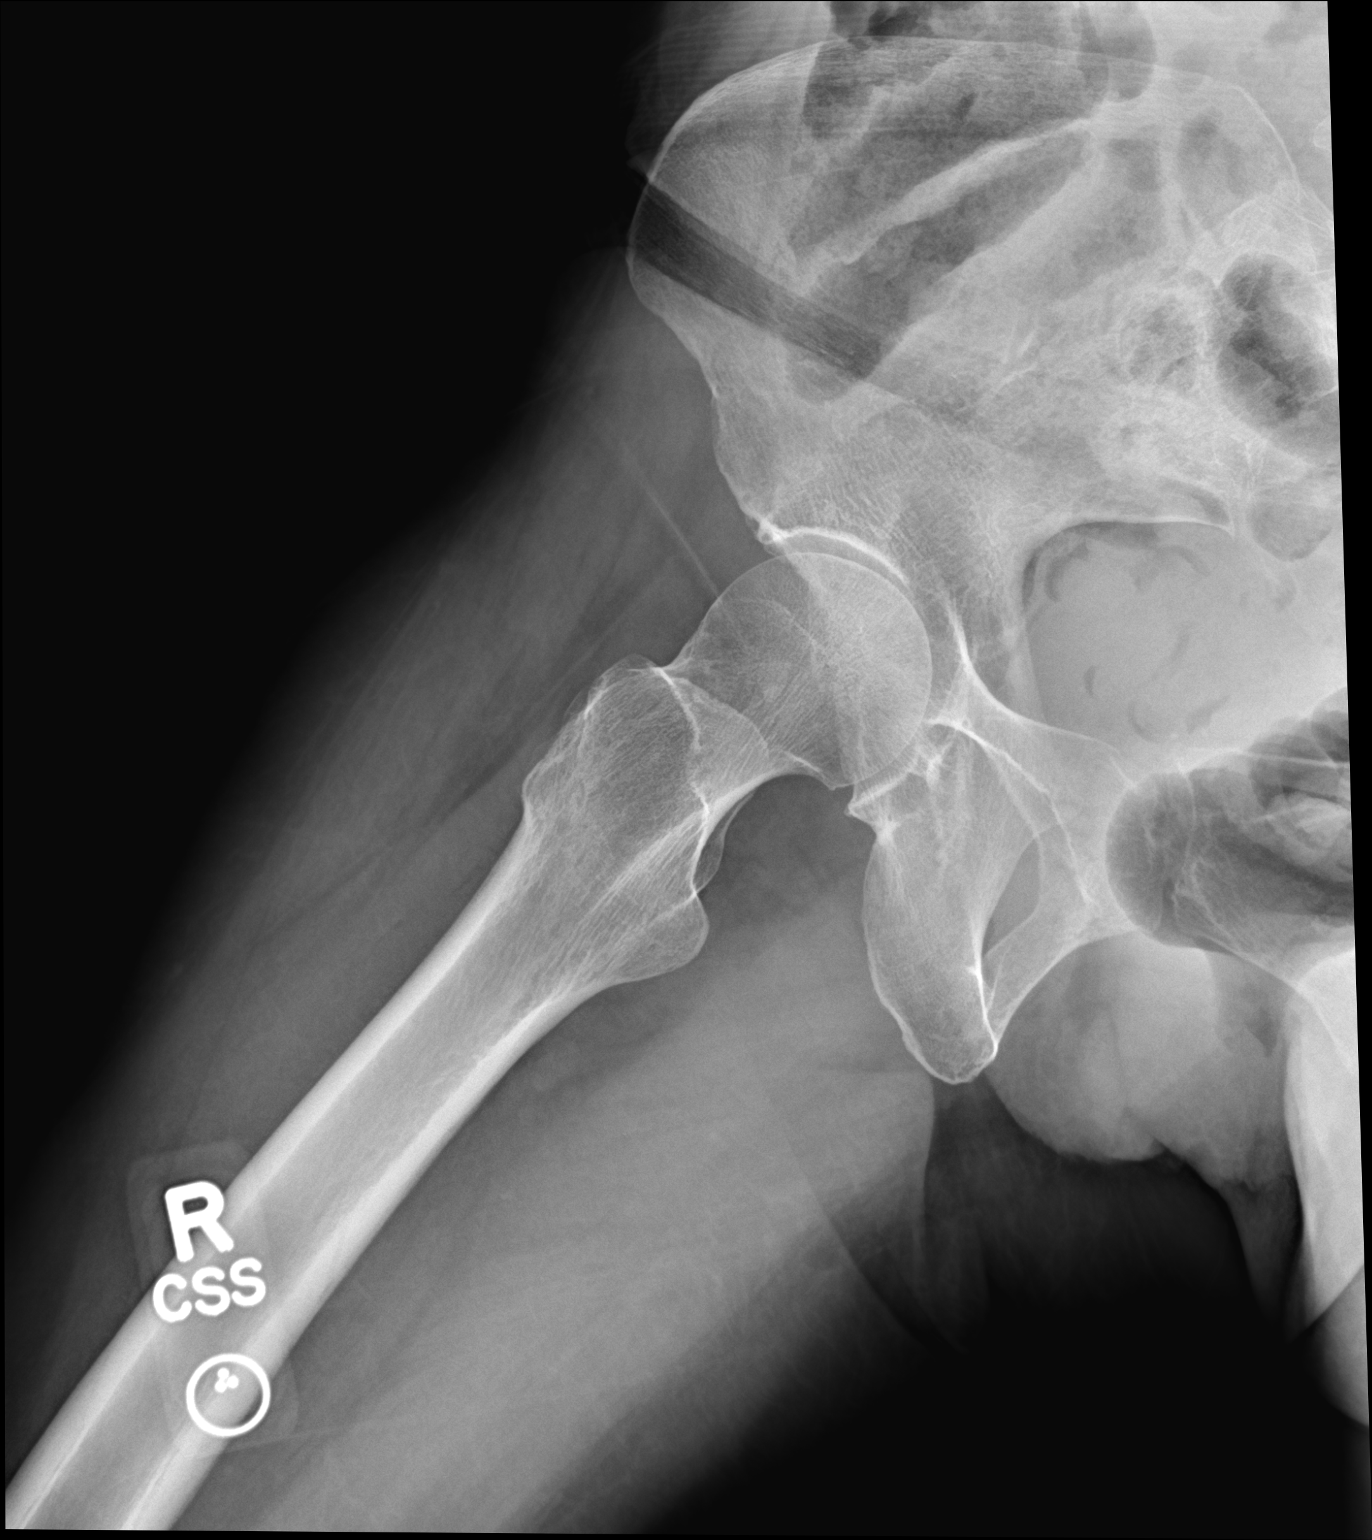

[3 of 3 positions shown; findings below may reference images not displayed]

EXAM

XR hip RT, 2-3V w or wo pelvis

INDICATION

r hip/groin pain
Pt states it feels like there is a knot on the Right side of hip, around the top of femur like a
band around top. Pt has to lift leg with her hands to sit/ lay down. CS

TECHNIQUE

AP view of the pelvis with AP and frogleg lateral view of the right hip

COMPARISONS

None available at the time of dictation.

FINDINGS

No radiographic evidence of an acute fracture, osseous malalignment, or aggressive focal osseous
lesion. There is anatomic joint alignment. Mild bilateral superior medial hip joint space loss.
Lumbosacral fusion hardware placement, incompletely characterized. Small right femoral head/neck
osseous bumps compatible with degenerative change.

IMPRESSION
1. Mild bilateral superior medial hip joint space loss compatible with degenerative change.

Tech Notes:

Pt states it feels like there is a knot on the Right side of hip, around the top of femur like a
band around top. Pt has to lift leg with her hands to sit/ lay down. CS

## 2021-08-20 IMAGING — MR Hips^ROUTINE
4 of 5 series · 31 of 48 positions shown · non-contrast
Comparison: none

[Series 2: T1 fat-sat · axial · 6.0mm · 0.74mm/px · z∈[-118,+91]mm · 11 of 29 slices shown]
[im 1/29]
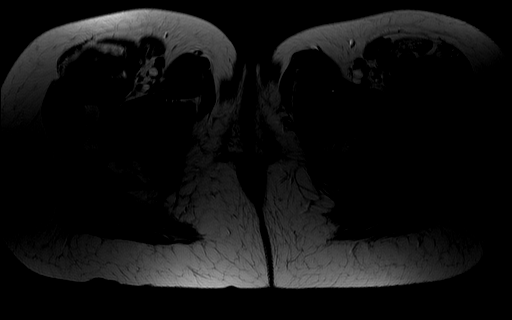
[im 3/29]
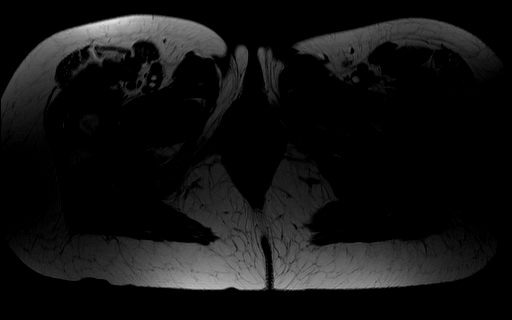
[im 6/29]
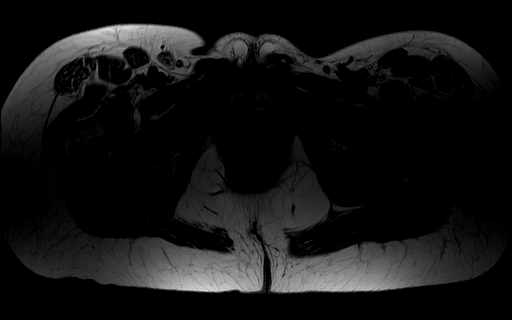
[im 9/29]
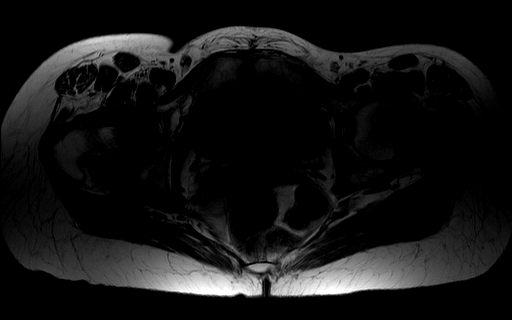
[im 12/29]
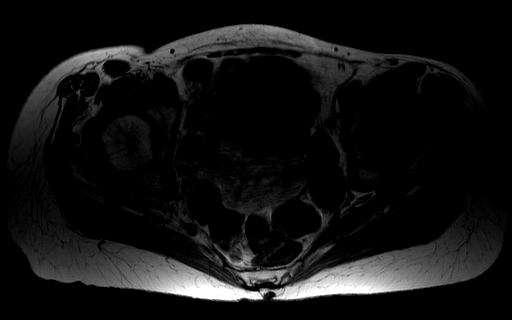
[im 15/29]
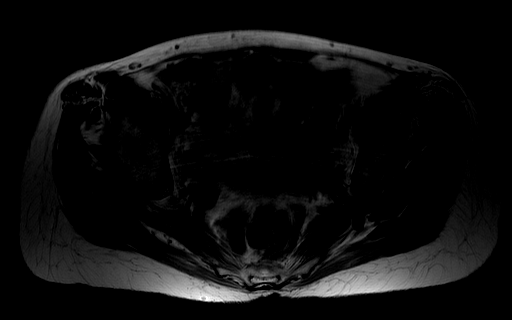
[im 17/29]
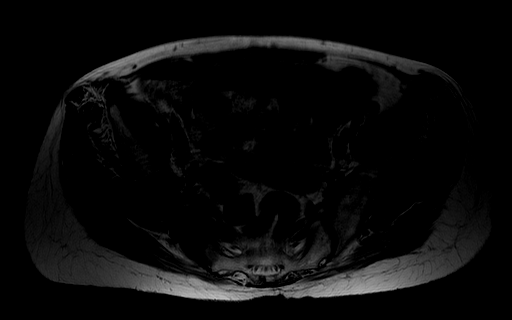
[im 20/29]
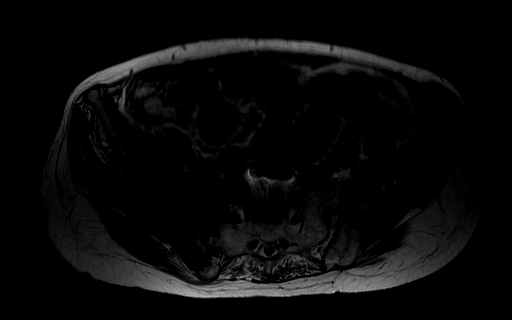
[im 23/29]
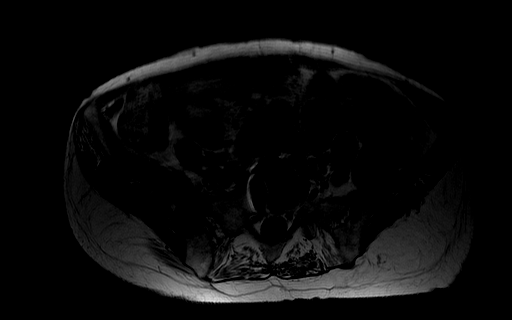
[im 26/29]
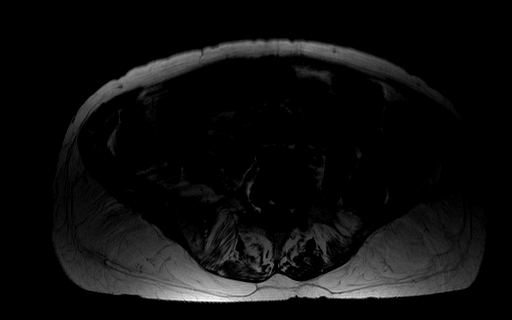
[im 29/29]
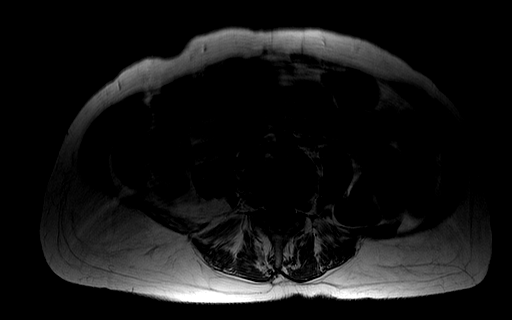

[Series 4: T1 · coronal · 4.0mm · 0.74mm/px · 4 of 20 slices shown]
[im 1/20]
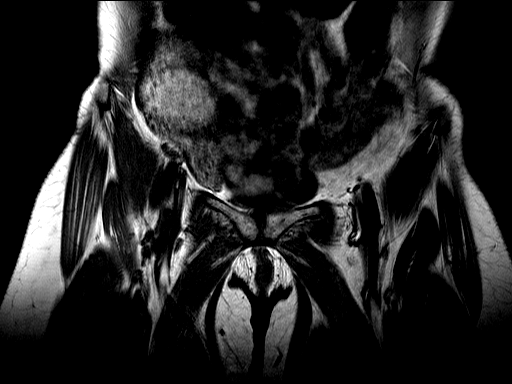
[im 3/20]
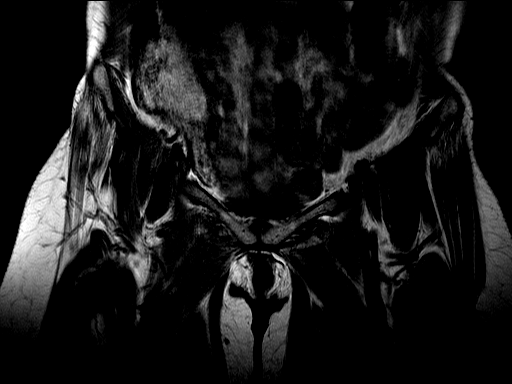
[im 11/20]
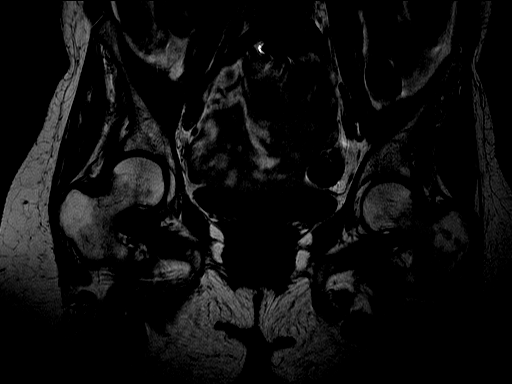
[im 17/20]
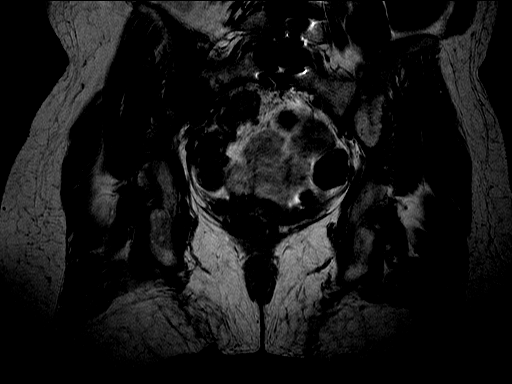

[Series 5: T2 fat-sat · axial · 5.0mm · 0.68mm/px · z∈[-126,+42]mm · 8 of 28 slices shown (1 of 2)]
[im 1/28]
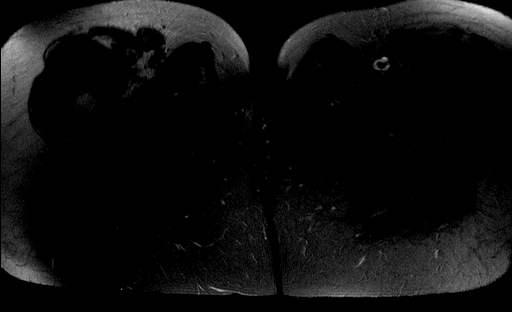
[im 4/28]
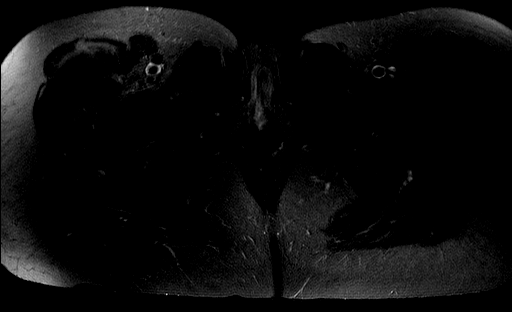
[im 10/28]
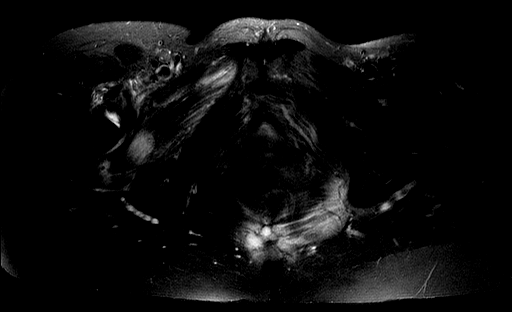
[im 13/28]
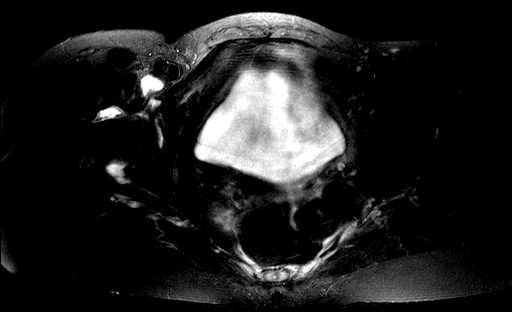
[im 16/28]
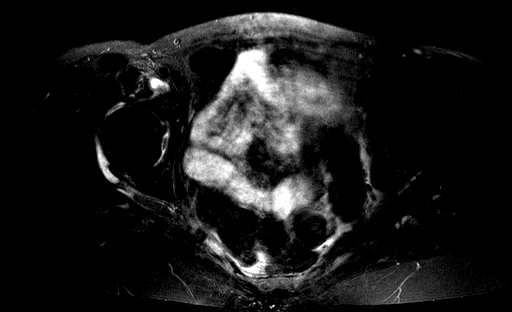
[im 19/28]
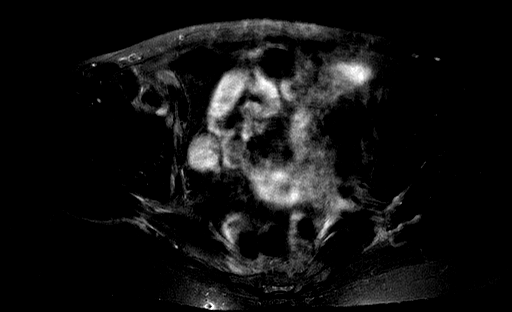
[im 25/28]
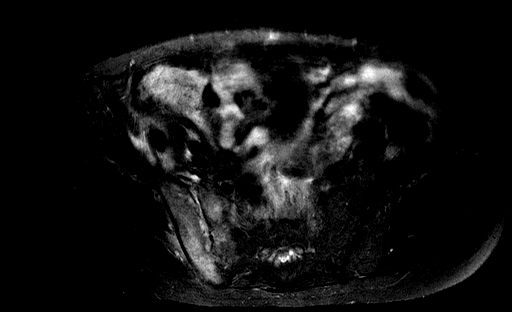
[im 28/28]
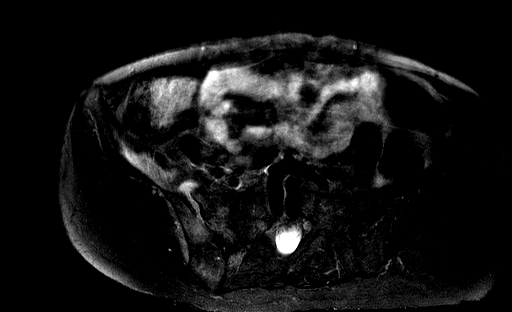

[Series 7: T2 fat-sat · sagittal · 3.5mm · 0.57mm/px · 8 of 21 slices shown (2 of 2)]
[im 1/21]
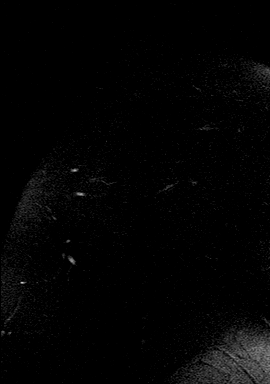
[im 3/21]
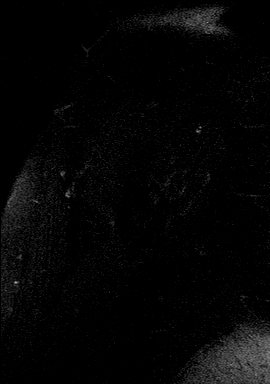
[im 6/21]
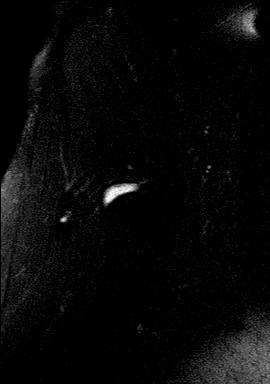
[im 9/21]
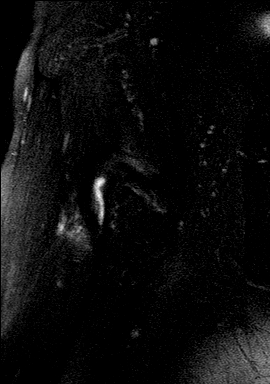
[im 12/21]
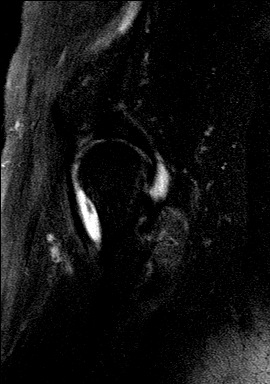
[im 15/21]
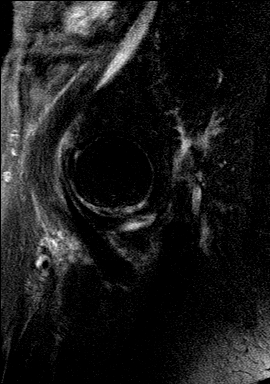
[im 18/21]
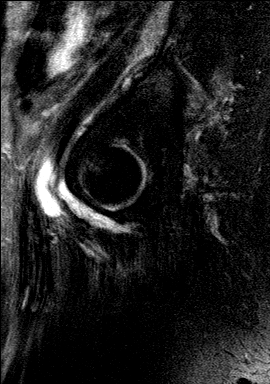
[im 21/21]
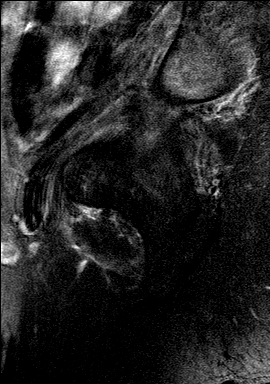

[31 of 48 positions shown; findings below may reference images not displayed]

DIAGNOSTIC STUDIES

EXAM

MRI right hip without contrast

INDICATION

rt hip pain
RT HIP PAIN X 1 MONTH, PT UNABLE TO BEAR WT ON HIP DUE TO PAIN.  STARTED AFTER BEING IN THE
HOSPITAL AND UNABLE TO MOVE FROM BED.  RG

TECHNIQUE

Sagittal, axial, and coronal images were obtained with variable T1 and T2 weighting.

COMPARISONS

None available

FINDINGS

Extensive of Marjan Mccarroll be seen involving the right sacroiliac joint image 18 series 4. Questionable
osseous destruction is seen along the inferior margins sacroiliac joint. Leading consideration would
be infection however insufficiency fracture and/or neoplasm could also account for this appearance.
CT scanning through the pelvis is recommended to evaluate the cortical bone.

There is no evidence for hip fracture or avascular necrosis. Postop changes lumbar spine are
partially imaged. There is edema involving the right upper thigh musculature and obturator externus
likely indicating muscular strain. Mild edema can be seen involving the right iliacus.

IMPRESSION

Marked marrow edema involving the right sacroiliac joint with possible mild cortical erosion as
described. There is associated muscular edema. Leading considerations would include posttraumatic
change versus infection and associated myositis. No discrete drainable fluid collection is noted.
Neoplasia is felt unlikely but not completely excluded. CT scan of the pelvis is recommended.

Small hip effusion. No evidence for avascular necrosis or fracture.

Tech Notes:

RT HIP PAIN X 1 MONTH, PT UNABLE TO BEAR WT ON HIP DUE TO PAIN.  STARTED AFTER BEING IN THE HOSPITAL
AND UNABLE TO MOVE FROM BED.  RG

## 2021-08-29 IMAGING — RF FL guided needle placement
1 series · 3 of 3 positions shown · non-contrast
Comparison: none

[Series 1: arthrogr 1 · 3 of 3 slices shown]
[im 1/3]
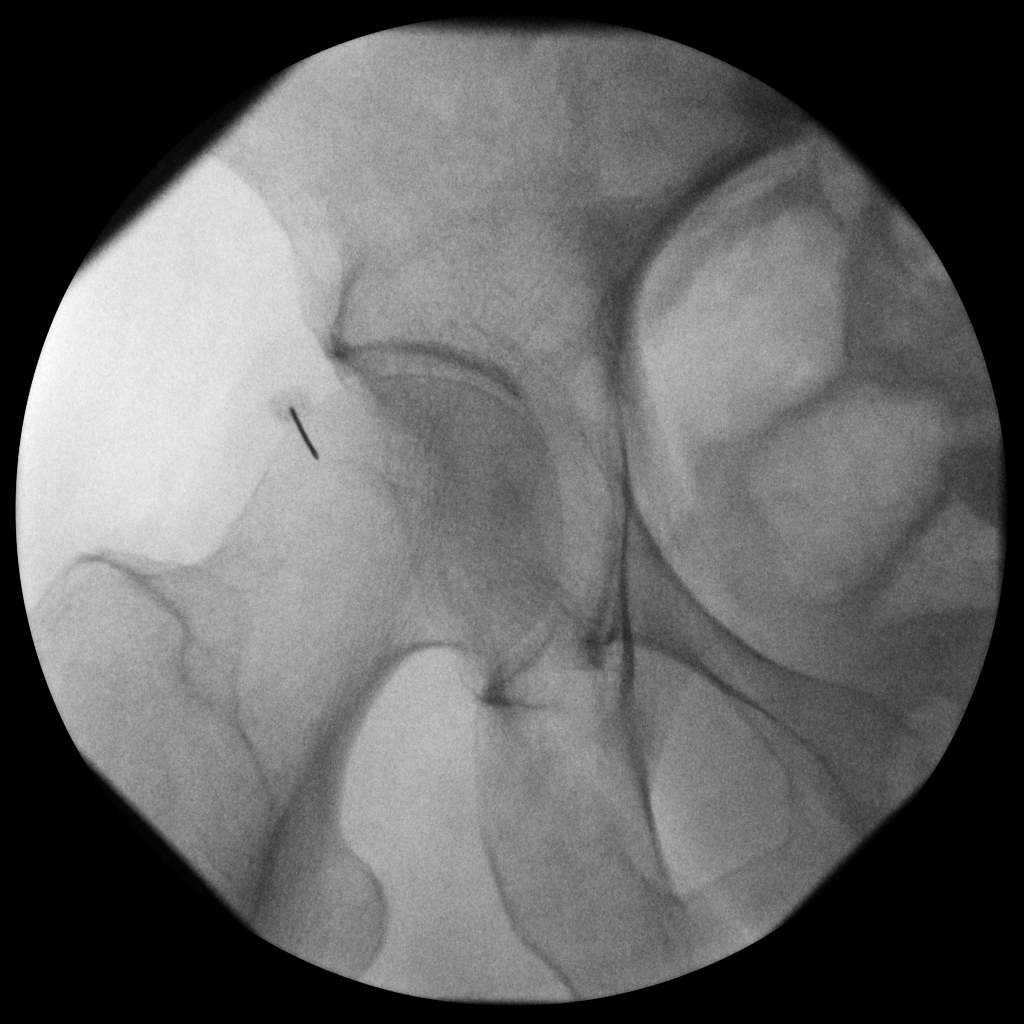
[im 2/3]
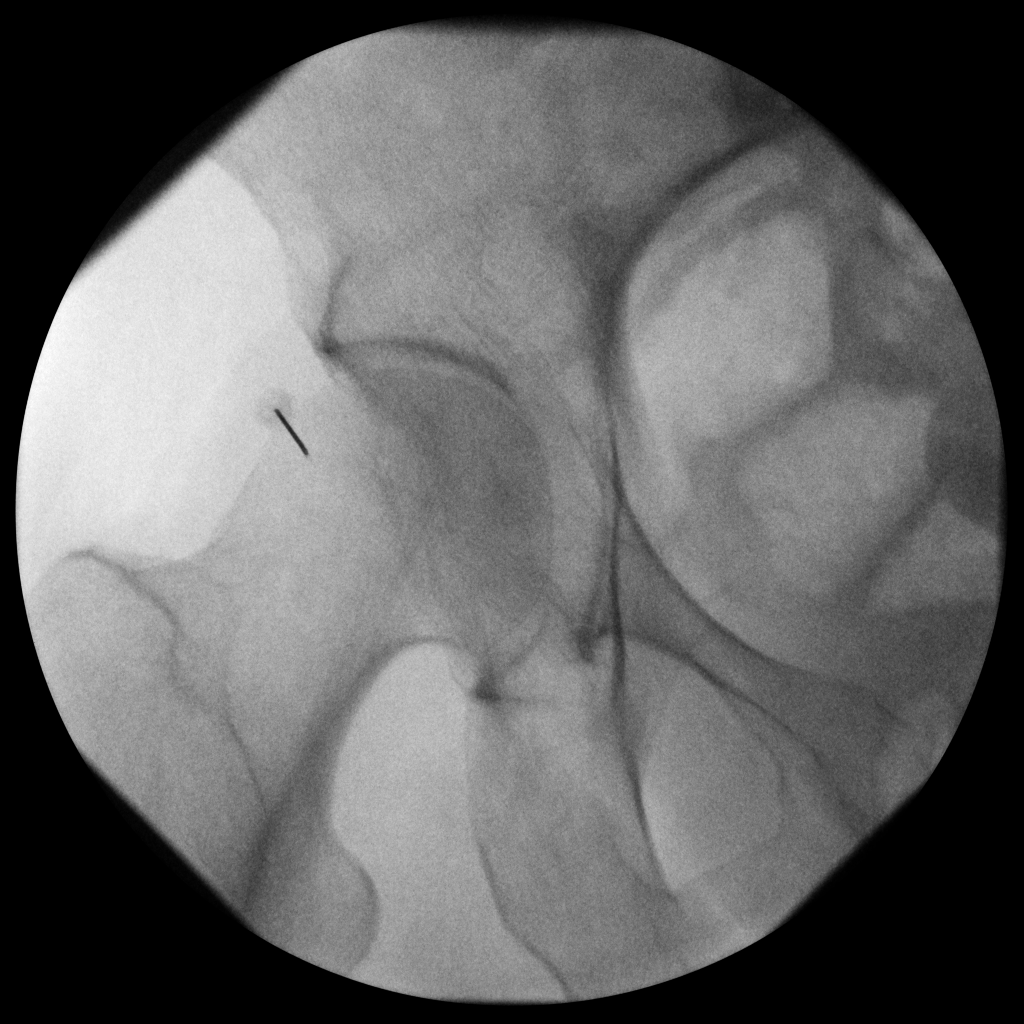
[im 3/3]
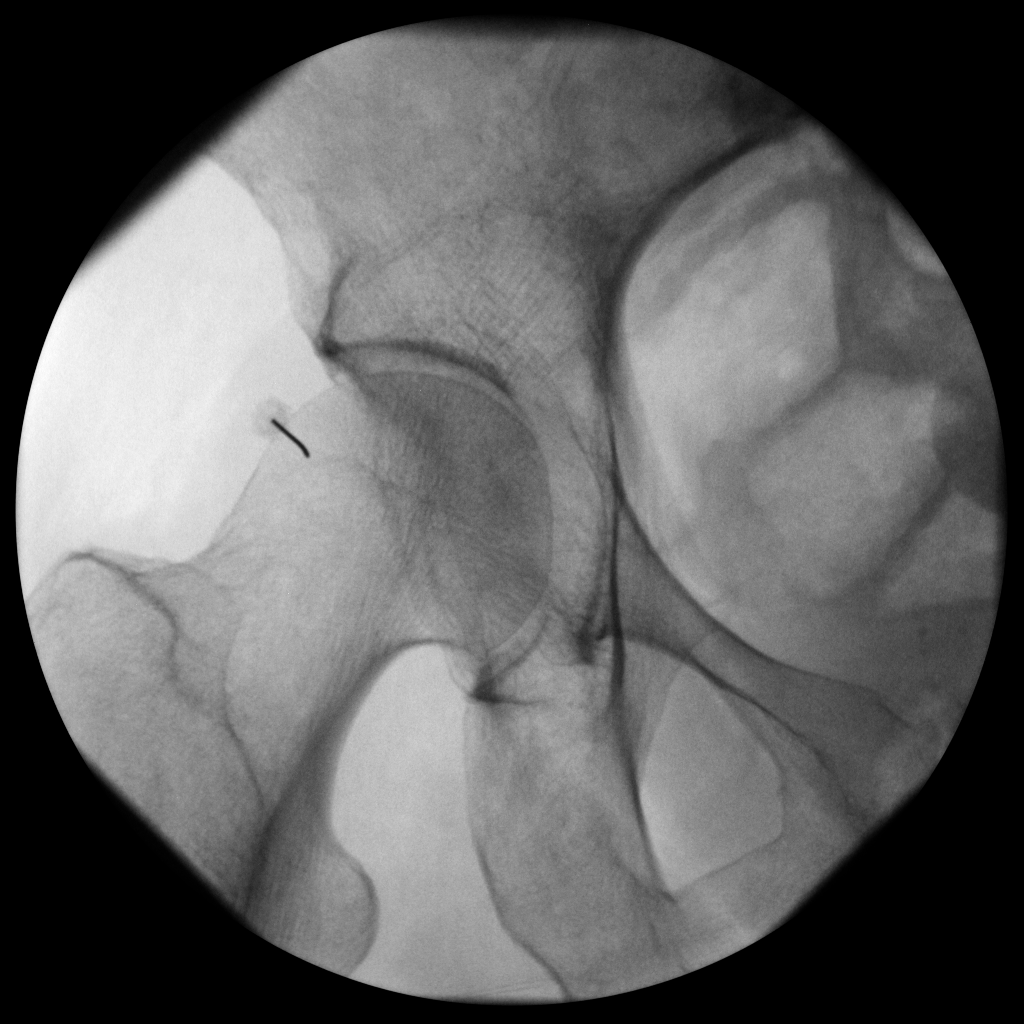

[3 of 3 positions shown; findings below may reference images not displayed]

EXAM

FL inj joint, major

INDICATION

possible infection
PAIN TO RT HIP FOR SEVERAL WEEKS, UNABLE TO BEAR WT.  FLUID SEEN ON MRI.  ATTEMPTED NEEDLE
ASPIRATION.

TECHNIQUE

0.3 MIN 12.615 MGY, 3 images

COMPARISONS

None available

FINDINGS

The procedure, benefits, risks, and possible complications were explained to the patient.
Additionally the risk of unsuccessful aspiration was explained to both the patient and the ordering
provider given the small amount of fluid seen on the prior MRI.. The patient acknowledged
understanding of the information and wished to have the procedure performed. All questions were
answered and informed consent was obtained.

Medications:

1% lidocaine

A time-out was performed. The patient was placed supine. Right hip was localized under fluoroscopic
guidance. 1 percent lidocaine was utilized for local anesthesia. Subsequently spinal needle was
inserted to the level of the right femoral head. Unable to aspirate fluid initially, so
approximately 15 cc of sterile saline was injected into the hip joint space. After continued
unsuccessful aspiration, the procedure was discontinued.

No immediate complications.

IMPRESSION

Unsuccessful right hip aspiration.

Tech Notes:

PAIN TO RT HIP FOR SEVERAL WEEKS, UNABLE TO BEAR WT.  FLUID SEEN ON MRI.  ATTEMPTED NEEDLE
ASPIRATION.  0.3 MIN 12.615 MGY

## 2021-09-03 ENCOUNTER — Encounter: Admit: 2021-09-03 | Discharge: 2021-09-03 | Payer: MEDICARE

## 2021-09-05 ENCOUNTER — Encounter: Admit: 2021-09-05 | Discharge: 2021-09-05 | Payer: MEDICARE

## 2021-09-05 IMAGING — MR Hips^ROUTINE
6 of 8 series · 31 of 48 positions shown · non-contrast
Comparison: none

[Series 3: T1 · coronal · 4.0mm · 0.74mm/px · 5 of 20 slices shown (1 of 2)]
[im 1/20]
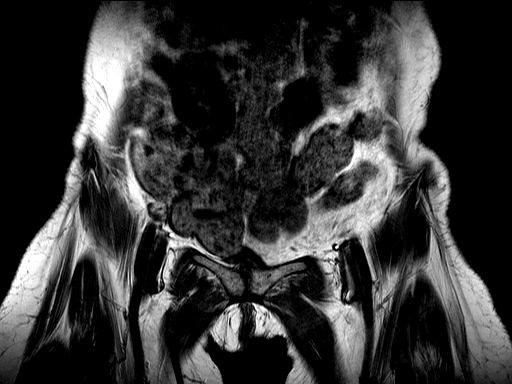
[im 5/20]
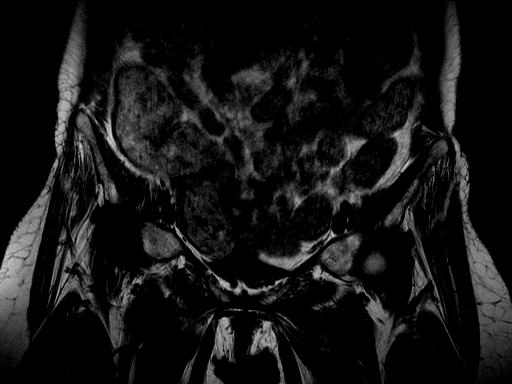
[im 10/20]
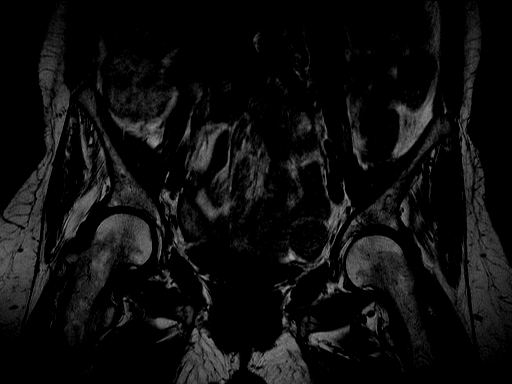
[im 15/20]
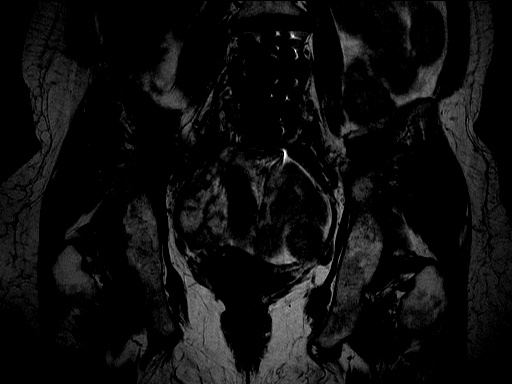
[im 20/20]
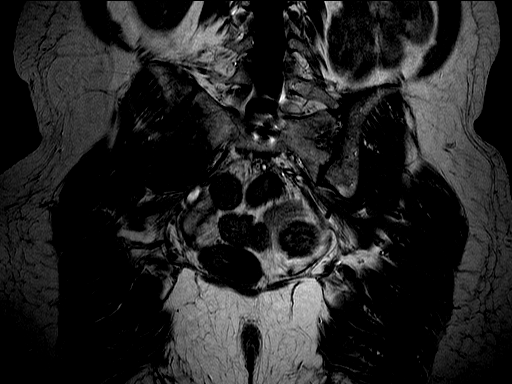

[Series 4: T2 fat-sat · axial · 5.0mm · 0.68mm/px · z∈[-150,+19]mm · 7 of 28 slices shown (1 of 2)]
[im 1/28]
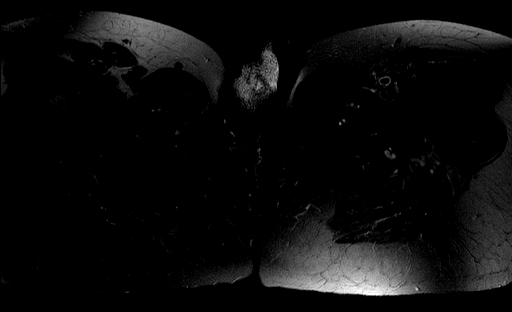
[im 5/28]
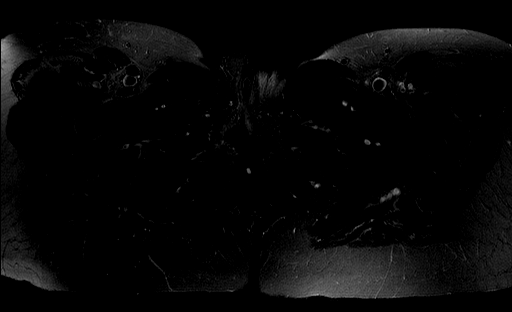
[im 10/28]
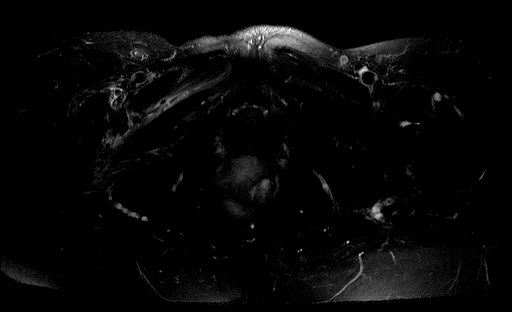
[im 14/28]
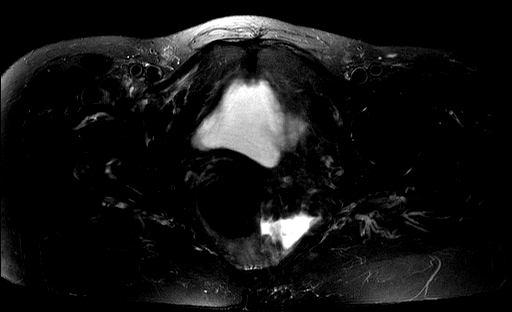
[im 19/28]
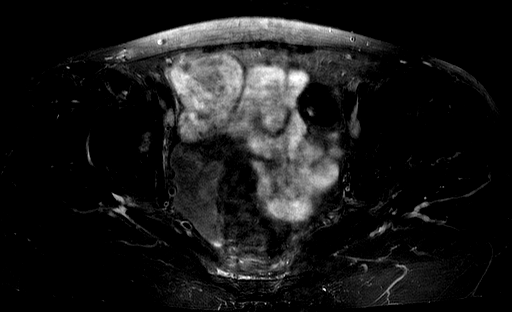
[im 23/28]
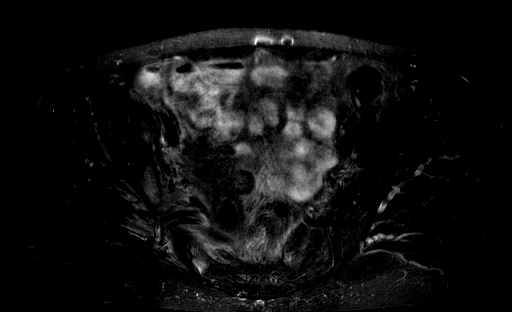
[im 28/28]
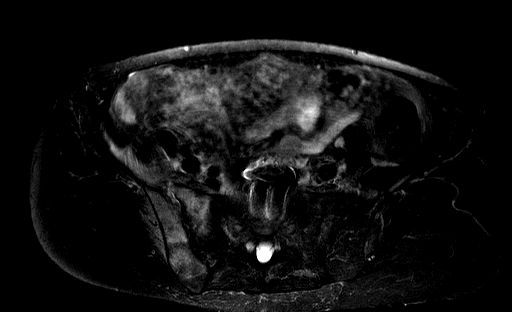

[Series 5: T1 fat-sat · axial · 6.0mm · 0.74mm/px · z∈[-151,+59]mm · 7 of 29 slices shown]
[im 1/29]
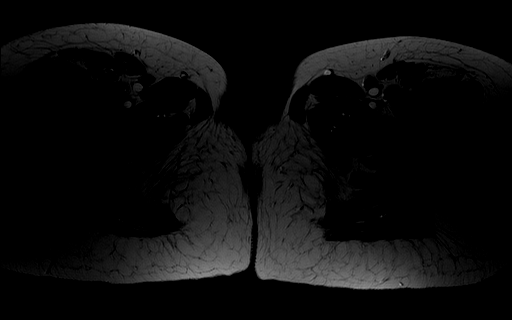
[im 5/29]
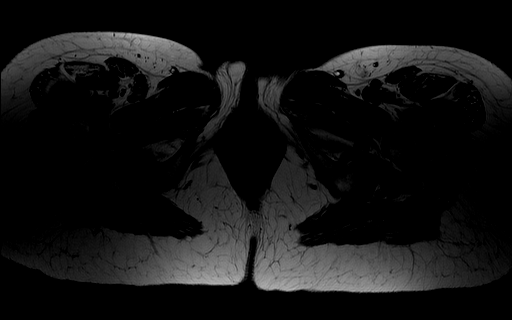
[im 10/29]
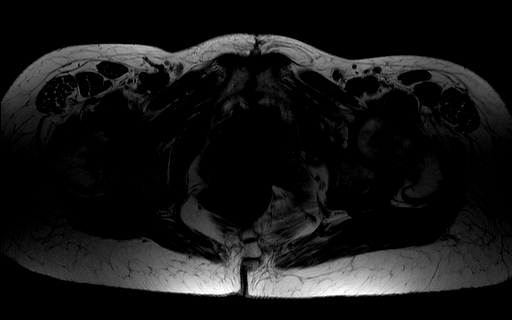
[im 15/29]
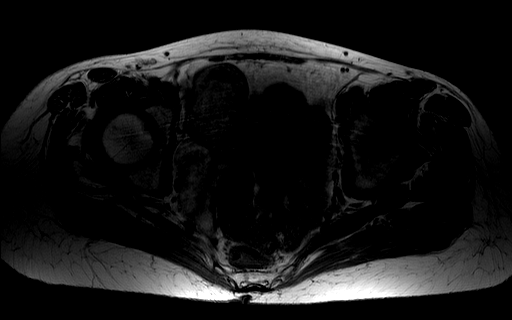
[im 19/29]
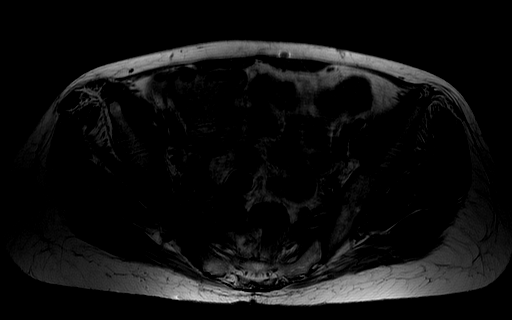
[im 24/29]
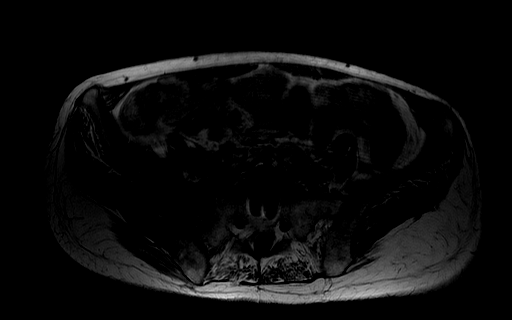
[im 29/29]
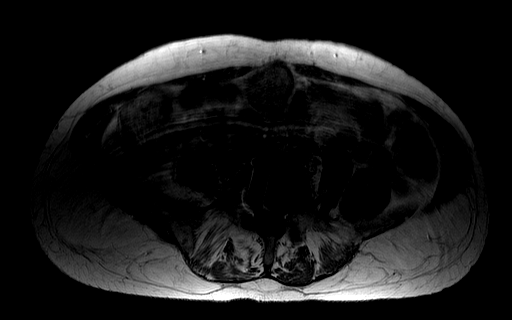

[Series 6: T1 · axial · 4.0mm · 0.68mm/px · z∈[-114,-4]mm · 6 of 23 slices shown (2 of 2)]
[im 1/23]
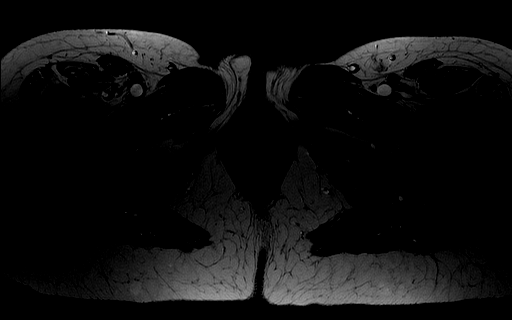
[im 5/23]
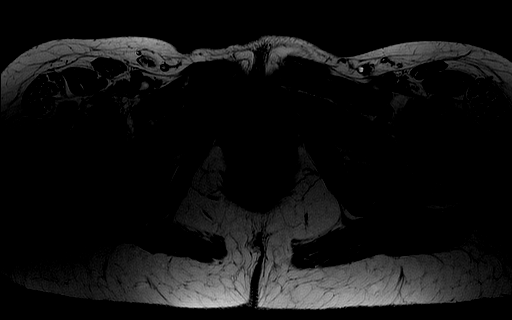
[im 9/23]
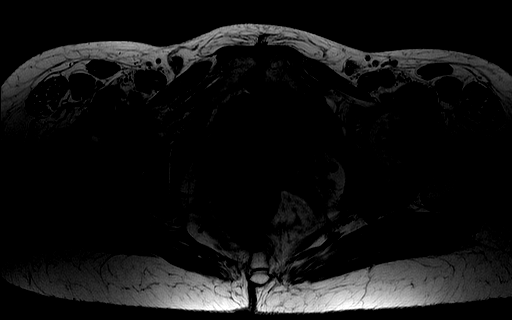
[im 14/23]
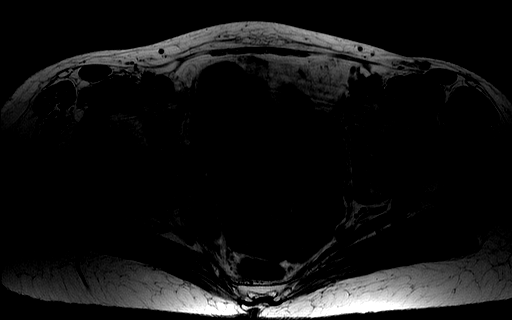
[im 18/23]
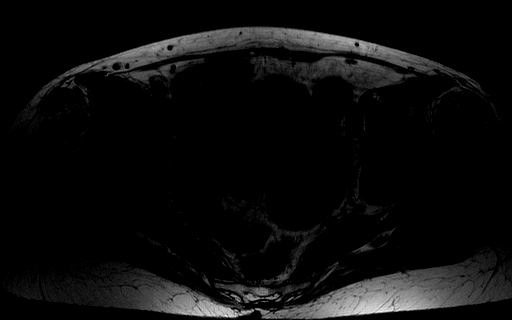
[im 23/23]
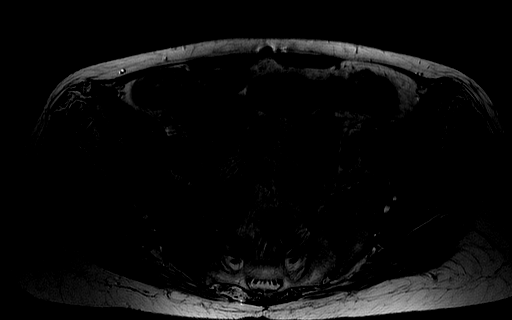

[Series 7: T2 fat-sat · sagittal · 3.5mm · 0.57mm/px · 5 of 21 slices shown (2 of 2)]
[im 1/21]
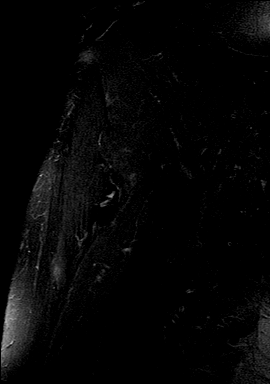
[im 6/21]
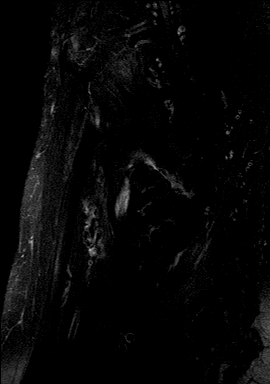
[im 11/21]
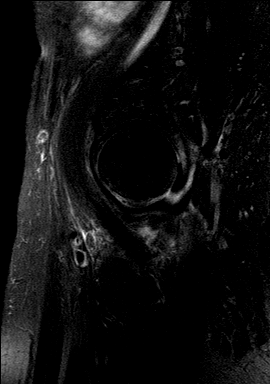
[im 16/21]
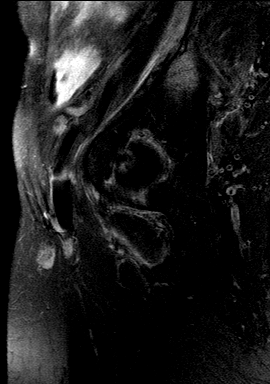
[im 21/21]
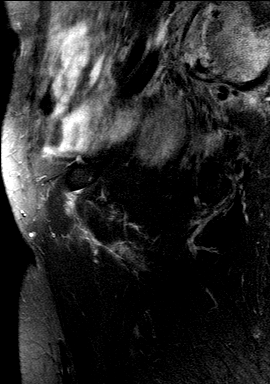

[Series 8: T1 fat-sat post-contrast · coronal · 4.0mm · 0.99mm/px · 1 of 22 slices shown]
[im 1/22]
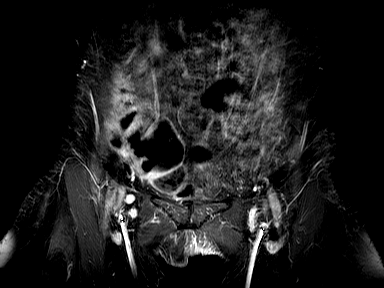

[31 of 48 positions shown; findings below may reference images not displayed]

Rendes, CRNA; Catherine, Myeisha; Osnayo, Sedwin

DIAGNOSTIC STUDIES

EXAM

MRI of the right hip with without contrast

INDICATION

PAIN
MYOSITIS, FU JUST FINISHED 7 DAYS ANTIBIOTICS.  15 ML GADAVIST RG

TECHNIQUE

Sagittal, axial, and coronal images were obtained with variable T1 and T2 weighting.

COMPARISONS

None available

FINDINGS

Extensive marrow edema is re-demonstrated involving the right sacral iliac joint with associated
enhancement. No obvious cortical destruction or drainable fluid collection is seen. There is edema
within the right obturator externus which is slightly improved from prior exam and a trace hip
effusion. Again no evidence for abscess is seen. Postop changes lumbar spine are noted. This is only
partially imaged.

IMPRESSION

Continued findings consistent with sacroiliitis and associated marrow edema without evidence for
cortical destruction or drainable abscess.

Findings suggesting myositis involving the right obturator externus are slightly improved. Trace
hip effusion is noted. Report called to Dr. Tabe at [DATE] p.m..

Tech Notes:

MYOSITIS, FU JUST FINISHED 7 DAYS ANTIBIOTICS.  15 ML GADAVIST RG

## 2021-09-05 NOTE — Progress Notes
60 yr old right hip pain for one month. Unable to bear weight. Previously admitted 07/23/2021 for left foot abscess and multi skin wounds.   No change in patient condition. Spoke with ID at [facility] yesterday. Said no tap needed if no BC+.  Now BC +staph.   11/15- Ortho consult- transfer to facility with ID  Imaging:  11/01- significant edema involving SI joint, muscular edema. Possible infection vs myositis.   11/17 repeat MRI- continued findings consistent with sacroiliitis and associated marrow edema without evidence for cortical destruction or drainable abscess  no changes from previous MRI. Small right hip effusion  Labs:  WBC 6.28, Plat 406  CRP 2.8, ESR 48>> 60  BC + staph  Meds:  Vancomycin day 6.   Vitals:  136/71  62  14  98.1  98%RA  AOX4   Procedures:  11/10 unsuccessful arthrocentesis d/t lack of fluid   Hx:  MRSA. IV drug abuse. Degenerative disc disease. Lumbar fusion.     Reason for Transfer:  No ortho till next week. Needs ID     11/15 note by Waldemar Dickens, [facility] RN   60 yo female pmhx of degenerative disk disease, lumbar fusion, back pain, substance abuse, chronic NSAID use 15+ tablets/day, and anxiety  Pt was admitted from clinic on 11/10 for right hip pain after  failed right hip joint aspiration.  MRI on 11/1 concern for marrow edema involving the right SI joint with mild cortical erosion along with  muscular edema.  now concern for osteomyelitis and myositis. Has recently had left foot abscess with MRSA I&D on 10/5   Currently is unable to bear weight   120/71  74  16  99% on RA  97.4  WBC - WNL  Chronic anemia 8.8   Cl 109   C02 20   Cr 0.59  Sed rate on 11/10 was 60   and 3.96 yesterday  Is currently on Vanc   No repeat imaging and no repeat cultures     Dont have Ortho for the rest of the week and also need ID

## 2021-09-06 NOTE — Progress Notes
60 yr old Female  OP ortho MRI diagnosed with myositis,effusion right hip.  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  Unsuccessful tap of effusion with IR.  Admitted for IV Abx and pain management.  Do not have r/o for osteo.  IV Vanco.  Pain management.  Ortho Surgeon not in town this week.  Spoke with Ortho at [facility] last pm. Michela Pitcher that he would have washed out after first MRI.  Repeat MRI yesterday unchanged.  + BC for staph  Added Rocephin yesterday after speaking with ID at [facility] do washout until Tuesday when Ortho returns, not sure if he will do it even then as he is not comfortable taking care of this patient without ID.  ESR and CRP are trending down  Increased pain today.  Had good ROM until today, now with significant pain causing limited ROM.  Imaging: MRI x 2, have been clouded    Labs: WBC 6,    Meds:    Vitals: 140/84  66  20  98.4  99% RA.    Procedure:    Hx: No PMH except anxiety, long term NSAID use and Poly Substance abuse, Low back pain.    Reason for transfer: Higher Level of Care

## 2021-09-10 ENCOUNTER — Encounter: Admit: 2021-09-10 | Discharge: 2021-09-10 | Payer: MEDICARE

## 2021-09-10 ENCOUNTER — Ambulatory Visit: Admit: 2021-09-10 | Discharge: 2021-09-10 | Payer: MEDICARE

## 2021-09-10 DIAGNOSIS — D649 Anemia, unspecified: Secondary | ICD-10-CM

## 2021-09-10 DIAGNOSIS — T8149XA Infection following a procedure, other surgical site, initial encounter: Secondary | ICD-10-CM

## 2021-09-10 DIAGNOSIS — F419 Anxiety disorder, unspecified: Secondary | ICD-10-CM

## 2021-09-10 DIAGNOSIS — M25551 Pain in right hip: Secondary | ICD-10-CM

## 2021-09-10 DIAGNOSIS — R2681 Unsteadiness on feet: Secondary | ICD-10-CM

## 2021-09-10 DIAGNOSIS — D75839 Thrombocytosis: Secondary | ICD-10-CM

## 2021-09-10 IMAGING — US ECHOCOMPL
1 series · 12 of 24 positions shown · non-contrast
Comparison: none

[Series 1: us echo 2d, complete · 57 acquisitions, 12 frames shown]
[im 3/57]
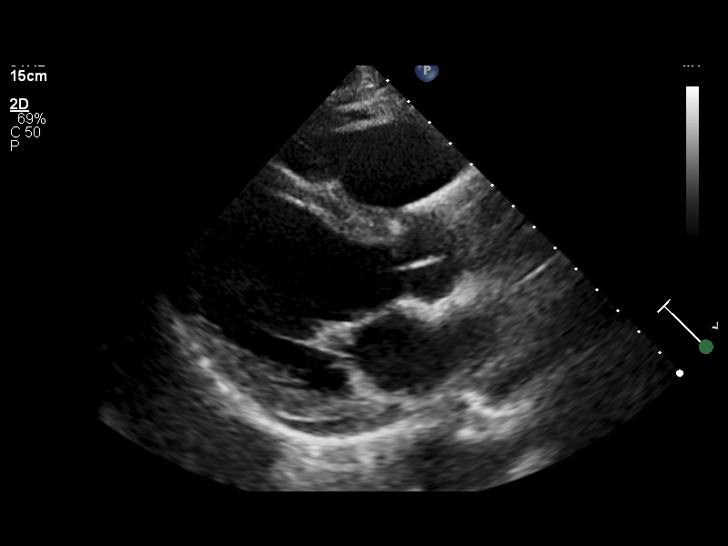
[im 5/57]
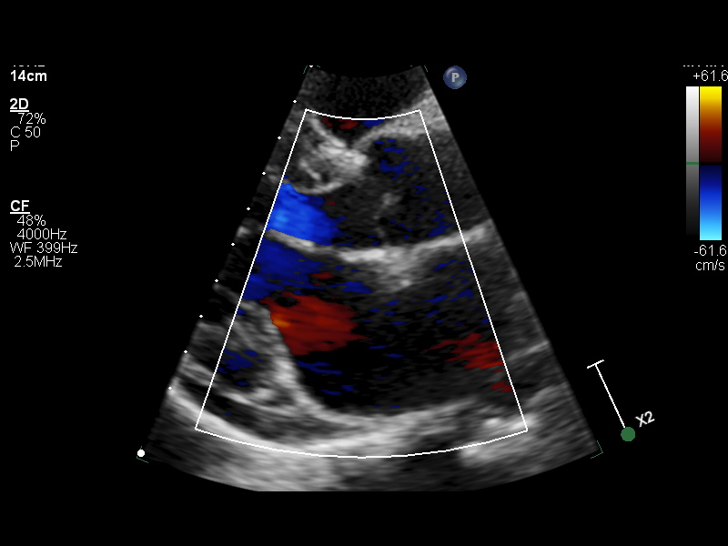
[im 10/57]
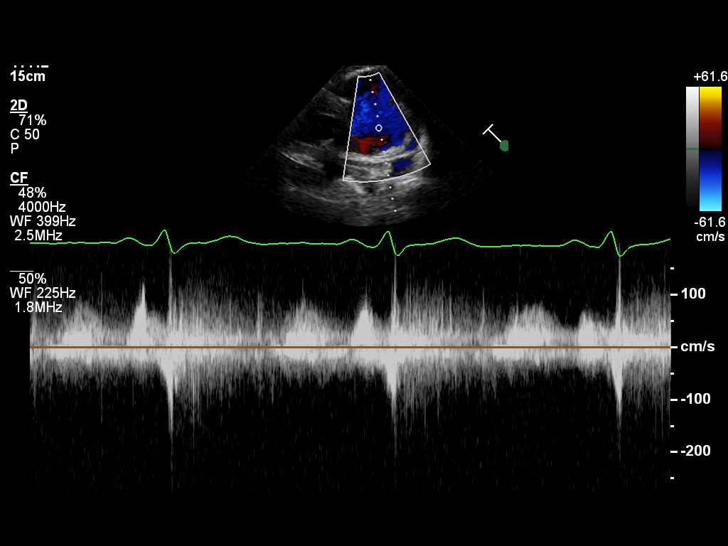
[im 15/57]
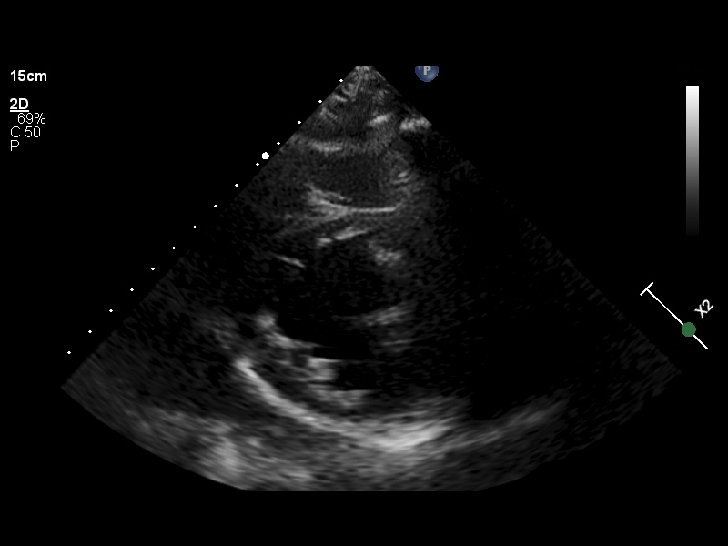
[im 18/57]
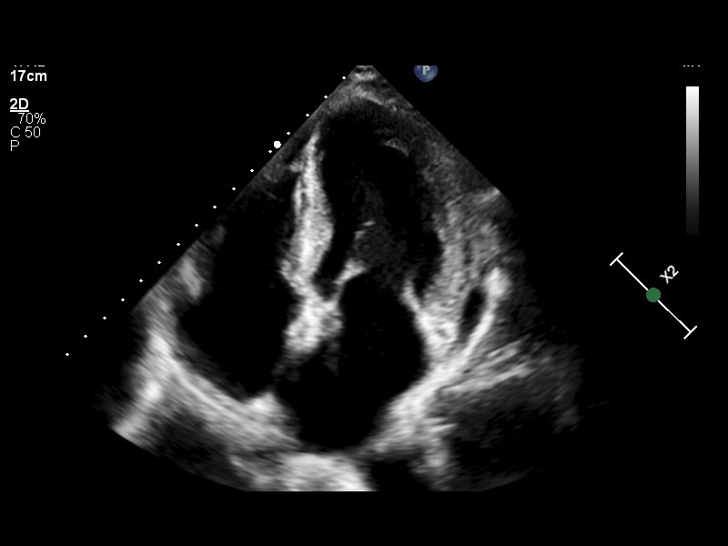
[im 22/57]
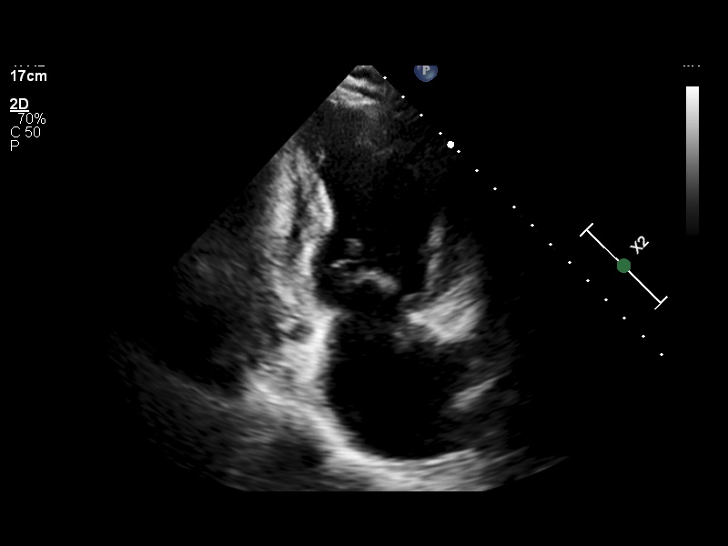
[im 27/57]
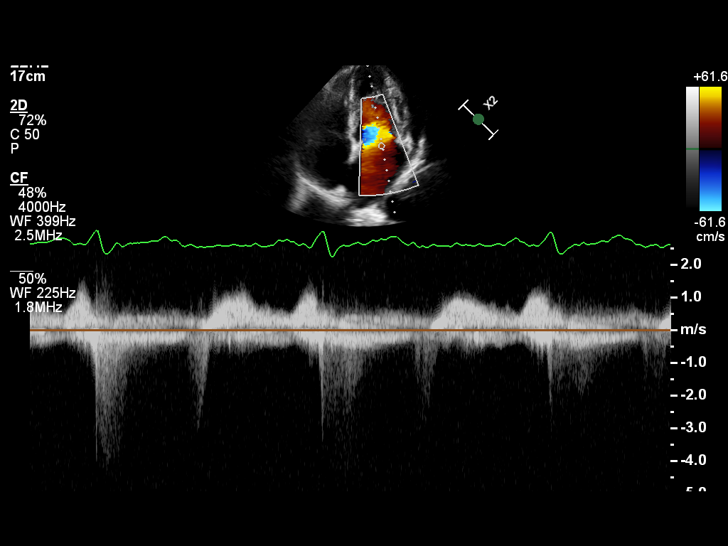
[im 35/57]
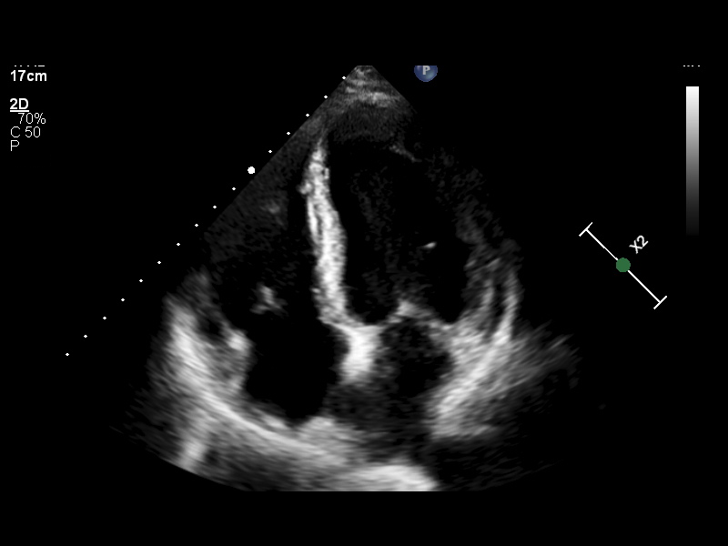
[im 39/57]
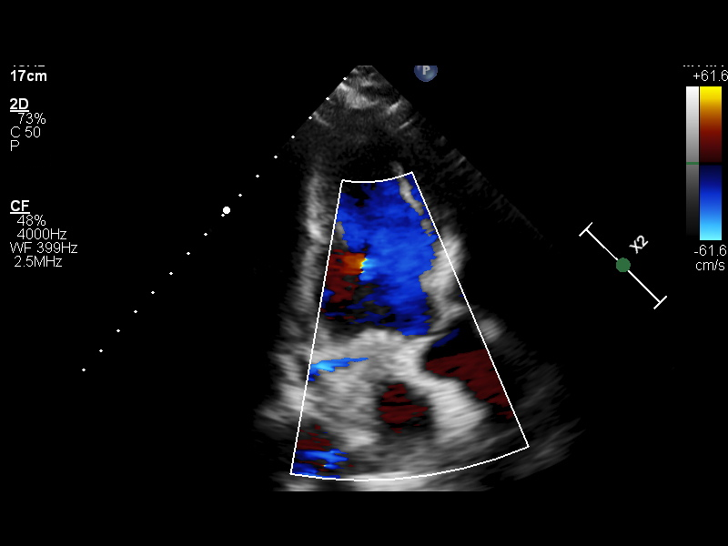
[im 44/57]
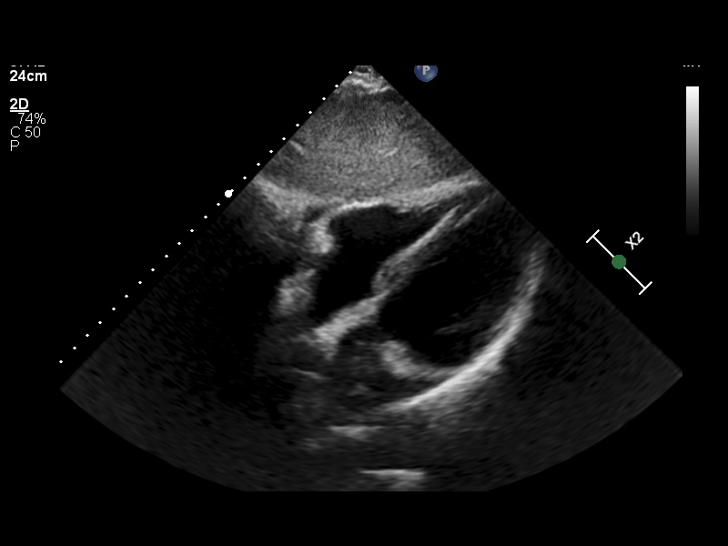
[im 49/57]
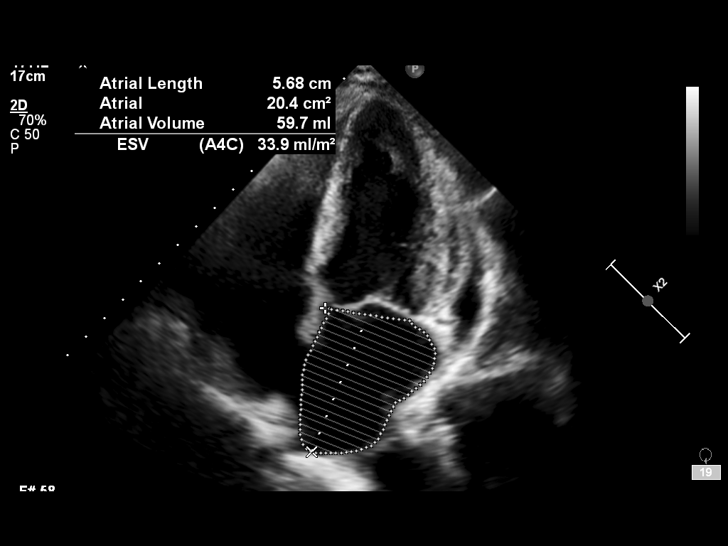
[im 57/57]
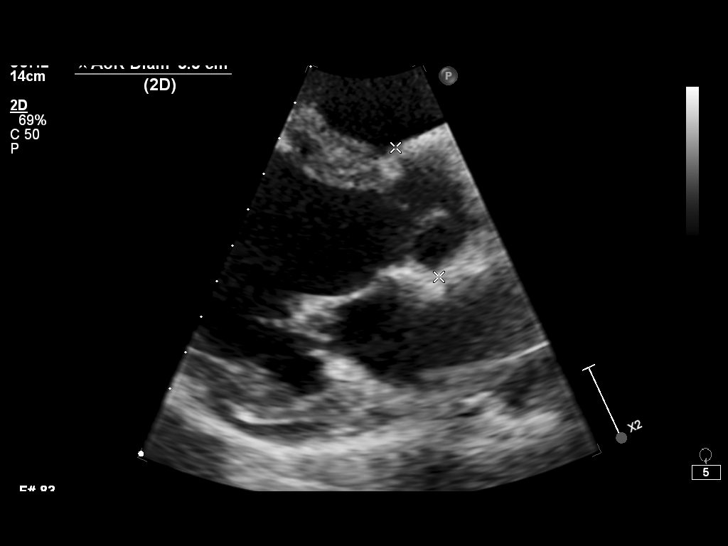

[12 of 24 positions shown; findings below may reference images not displayed]

09/10/21 -  2D + DOPPLER ECHO
Location Performed: [HOSPITAL]

Referring Provider:
Fellow:
Location of Interp: [facility]
Sonographer: External Staff

[MINNAT]-Kurnia, Jenald

Indications: Endocarditis

Vitals
Height   Weight   BSA (Calculated)   BP   Comments
165.1 cm (5' 5")   68.5 kg (151 lb)   1.77   133/87

Interpretation Summary
Normal left ventricular contractility.  LVEF 65.8%.
Mild right ventricular dilatation with normal right ventricular contractility.
Moderate left atrial dilatation.  Normal right atrial dimensions.  Normal central venous pressure.
Nonspecific mitral leaflet thickening and mild mitral annular calcification with normal mitral valve
leaflet motion.  Trace mitral regurgitation.
Tricuspid valve motion.  Trace tricuspid regurgitation but peak velocity is not well-defined and PA
pressure, 28 mmHg, may be underestimated.
Mild aortic sclerosis with normal aortic cusp motion.  No aortic stenosis or aortic regurgitation on
Doppler interrogation.
The pulmonic valve and the pulmonary artery are not well-visualized.
Normal aortic root and proximal ascending aortic dimensions.  The transverse aorta is not
visualized.
Trivial pericardial effusion.
No mobile valvular masses are appreciated on this study.

Echocardiographic Findings
Left Ventricle   The left ventricular size is normal. Severe concentric hypertrophy. The left
ventricular systolic function is normal. Biplane LVEF 65.8%.  On the prior study on July 22, 2021,
biplane LVEF was 63.5% and visually estimated LVEF was 60-65%. The ejection fraction by Simpson's
biplane method is 66%. Normal septal motion. Grade I (mild) left ventricular diastolic dysfunction.
Normal left atrial pressure.
Right Ventricle   The right ventricle is mildly dilated. Mild right ventricular dilatation was
reported on the prior study. The right ventricular systolic function is normal.
Left Atrium   Moderately dilated. Moderate atrial dilatation was reported on the prior study.
Right Atrium   Normal size.
IVC/SVC   Normal central venous pressure (0-5 mm Hg). Normal central venous pressure was reported on
the prior study.
Mitral Valve   Non-specific thickening. Mild mitral annular calcification was probably present on
the prior study. No stenosis. Trace regurgitation. Trace mitral regurgitation was reported on the
prior study. There is mild mitral annular calcification.
Tricuspid Valve   Normal valve structure. No stenosis. Trace regurgitation. Tricuspid peak
regurgitation velocity   2.5 m/s but is not well-defined and PA pressure may underestimated.  Mild
tricuspid regurgitation reported on the prior study with peak velocity of 2.42 m/s and estimated PA
pressure 26 mmHg.
Aortic Valve   The valve is sclerotic. Probable tricuspid aortic valve with normal aortic cusp
motion.  Mild aortic sclerosis.  Focal aortic valve thickening was reported on the prior study. No
stenosis. No regurgitation.
Pulmonary   The pulmonic valve was not well seen.
The pulmonary artery was not well seen.
Aorta   The aortic root and ascending aorta are normal in size.  The transverse aorta is not
visualized.
Pericardium   Trivial pericardial effusion.

Left Ventricular Wall Scoring
Resting   Score Index: 1.000   Percent Normal: 100.0%

The left ventricular wall motion is normal.

Left Heart 2D Measurements (Normal Ranges)
EF (Simpson's)
66 %
LVIDD
5.1 cm  (Range: 3.8 - 5.2)
LVIDS
3.2 cm  (Range: 2.2 - 3.5)
IVS
1.1 cm  (Range: 0.6 - 0.9)
LV PW
1.2 cm  (Range: 0.6 - 0.9)
LA Size
3.9 cm  (Range: 2.7 - 3.8)

Left Heart 2D Addnl Measurements (Normal Ranges)
LV Systolic Vol
36 mL  (Range: 14 - 42)
LV Systolic Vol Index
20 mL/m2  (Range: 8 - 24)
LV Diastolic Vol
104 mL  (Range: 46 - 106)
LV Diastolic Vol Index
59 mL/m2  (Range: 29 - 61)
LA Vol
74 mL  (Range: 22 - 52)
LA Vol Index
41.81 mL/m2  (Range: 16 - 34)
LV Mass
227 g  (Range: 67 - 162)
LV Mass Index
128 g/m2  (Range: 43 - 95)
RWT
0.47  (Range: <=0.42)

Aortic Root Measurements (Normal Ranges)
Sinus
3.5 cm  (Range: 2.4 - 3.6)

Doppler (Spectral and Color Flow)
Estimated Peak Systolic PA Pressure
Aortic valve peak gradient
Aortic valve peak velocity
2.1 m/s

Tech Notes:

## 2021-10-31 ENCOUNTER — Encounter
Admit: 2021-10-31 | Discharge: 2021-10-31 | Payer: MEDICARE | Primary: Student in an Organized Health Care Education/Training Program

## 2021-11-05 ENCOUNTER — Encounter
Admit: 2021-11-05 | Discharge: 2021-11-05 | Payer: MEDICARE | Primary: Student in an Organized Health Care Education/Training Program

## 2021-11-07 IMAGING — CR HIPCMLT
3 series · 3 of 3 positions shown · non-contrast
Comparison: none

[hip ap pelvis]
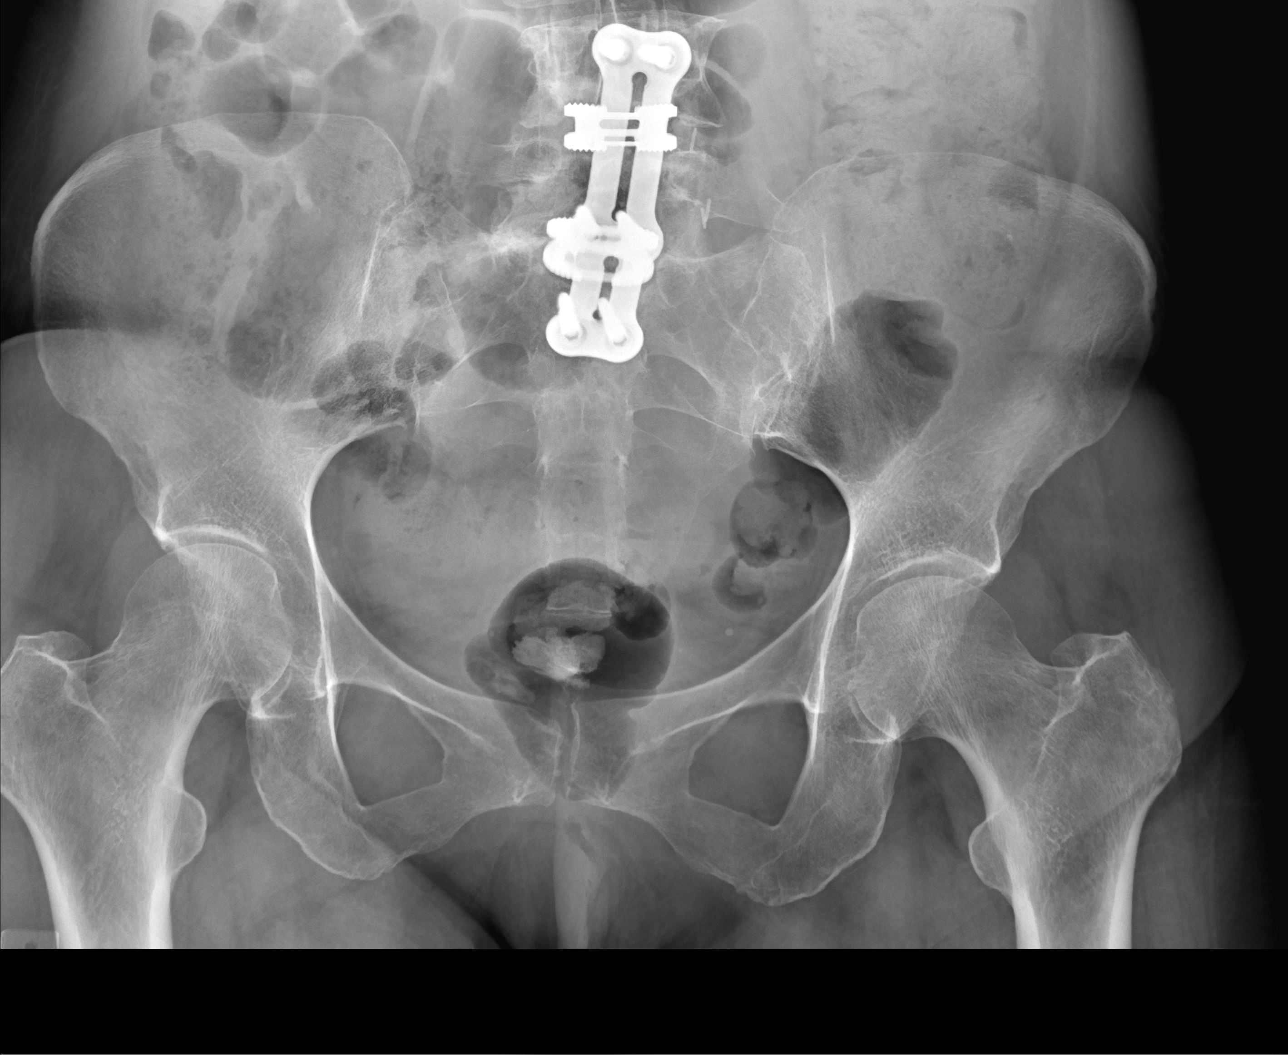

[hip ap]
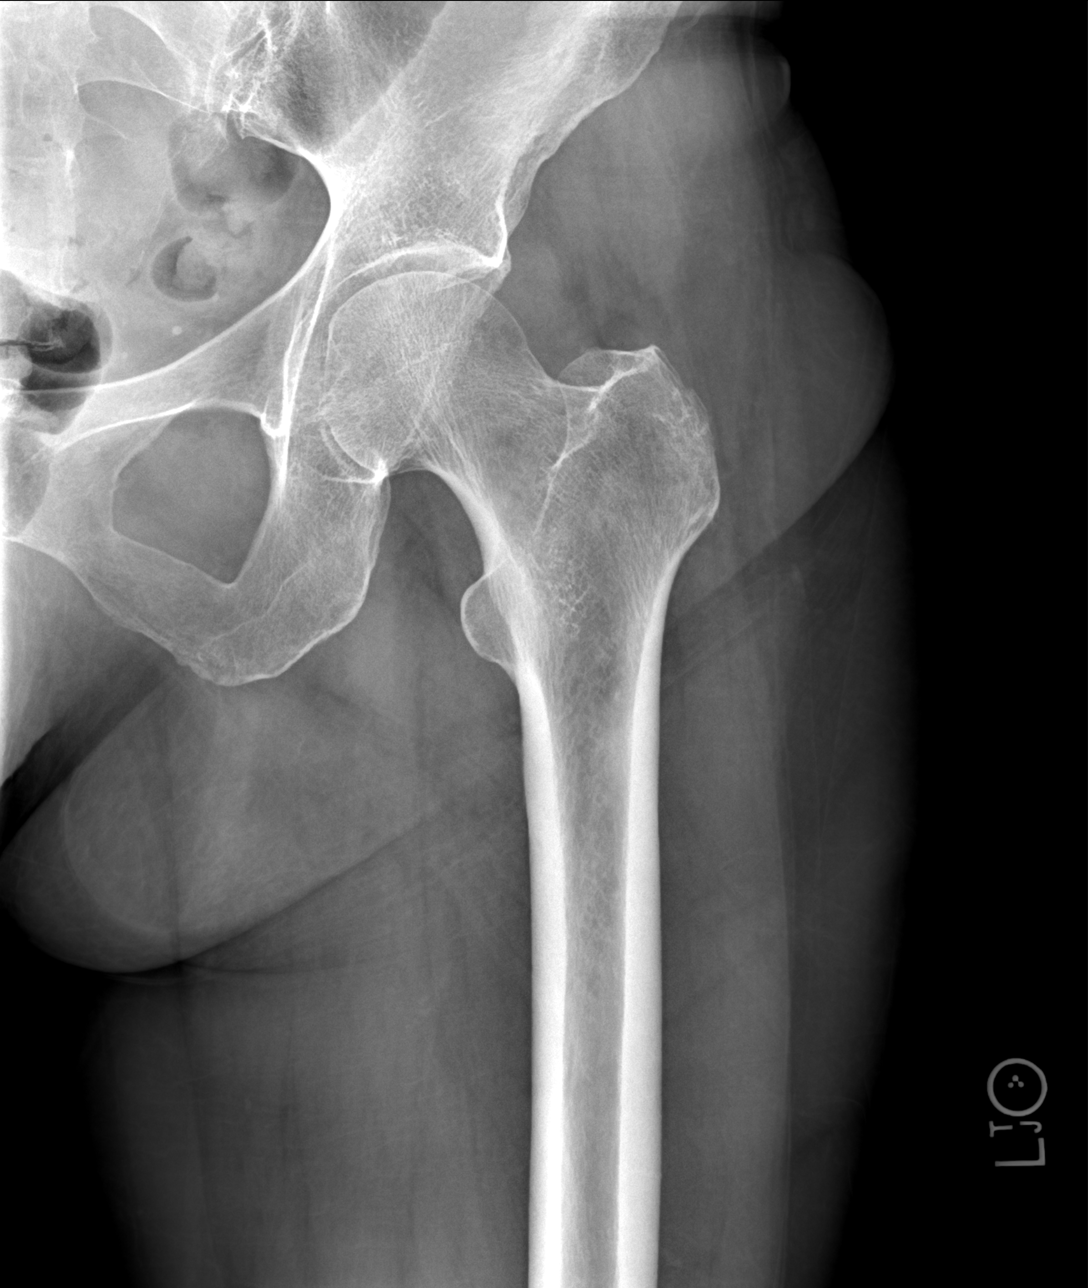

[hip frog lat]
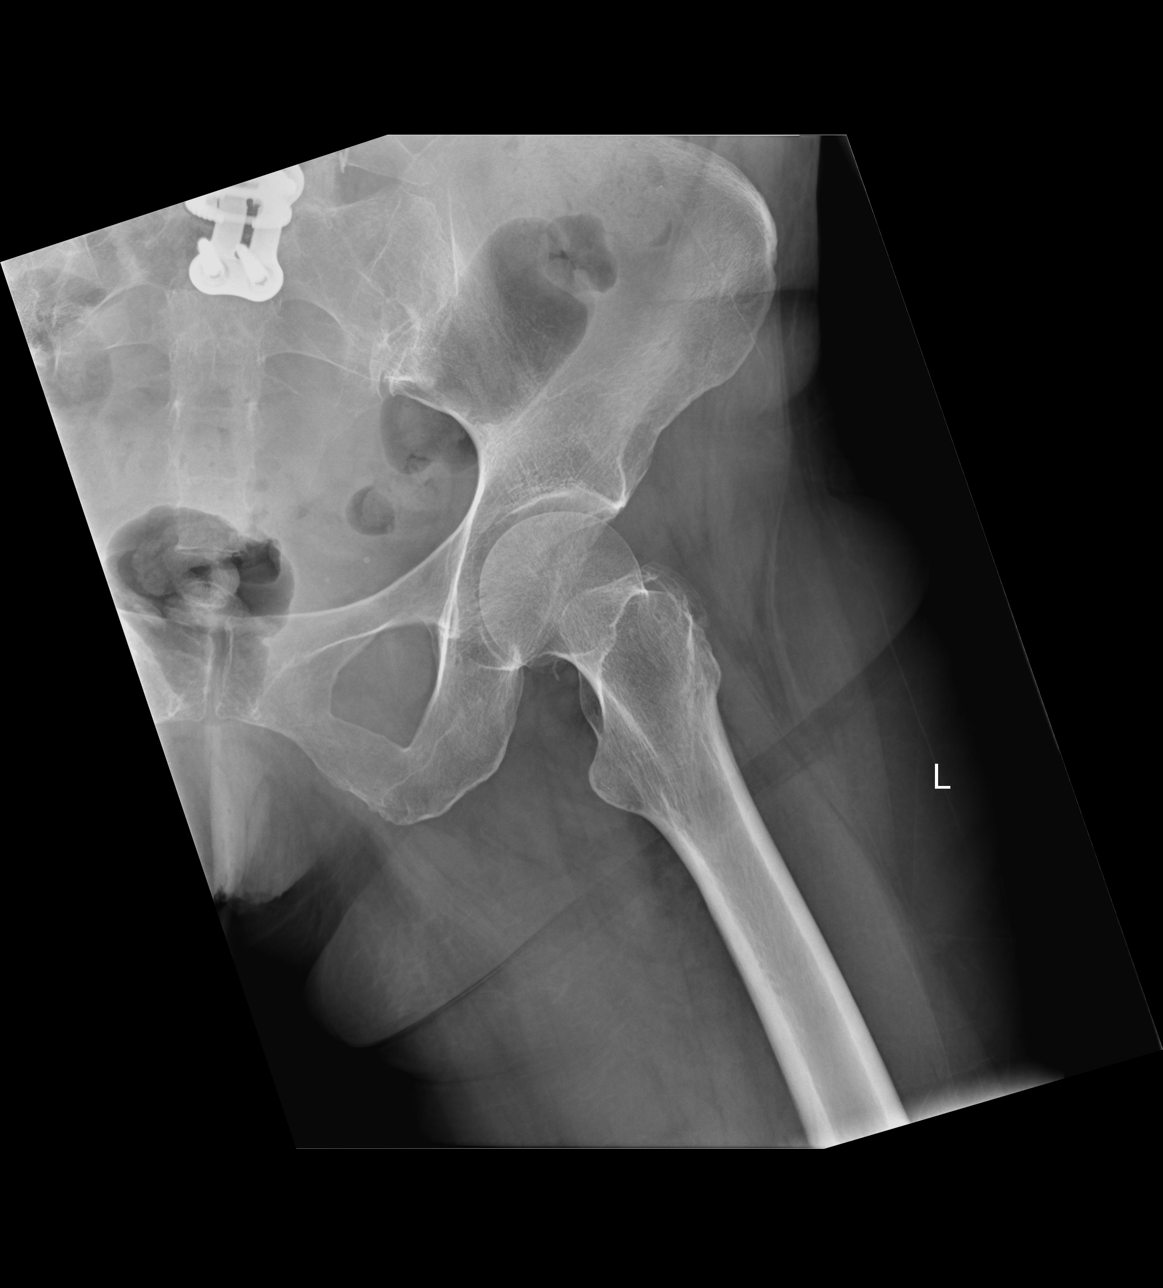

[3 of 3 positions shown; findings below may reference images not displayed]

DIAGNOSTIC STUDIES

EXAM

XR hip LT, 2-3V w or wo pelvis

INDICATION

with pelvis, left hip pain
PT C/O LEFT HIP PAIN. TJ

TECHNIQUE

AP pelvis AP and lateral views left hip

COMPARISONS

None available

FINDINGS

No fractures or dislocations are seen. Postop changes lumbar spine are partially imaged. Joint
space is generally well preserved.

IMPRESSION

No fractures or dislocations.

Tech Notes:

PT C/O LEFT HIP PAIN. TJ

## 2021-11-07 IMAGING — CT PELVIS_W(Adult)
2 of 4 series · 9 of 46 positions shown, 10 images · non-contrast
Comparison: none

[Series 2: pelvis ax 5.00 br40 s3 (person_name) · axial · 0.45mm/px · z∈[+1407,+1612]mm · 6 of 57 slices shown, 7 images]
[im 8/57  soft-tissue]
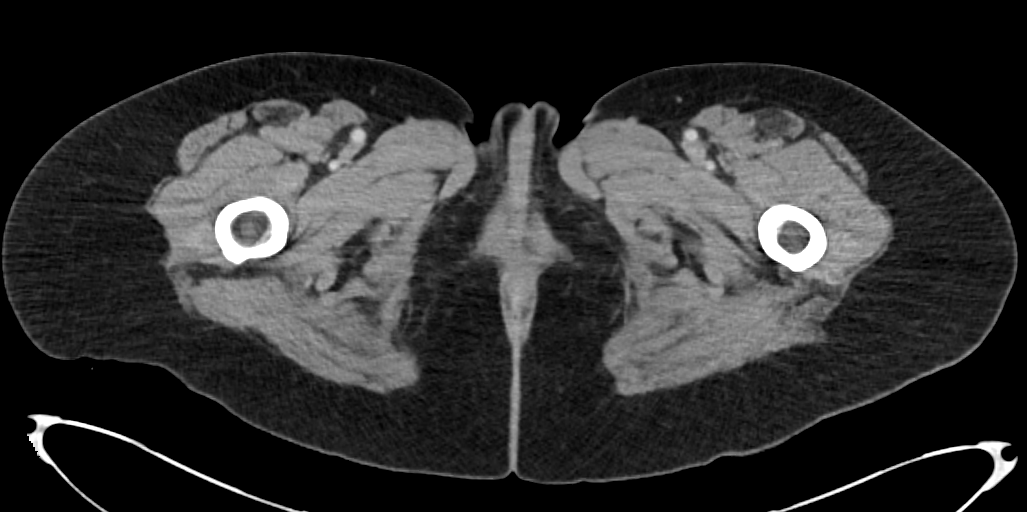
[im 8/57  bone]
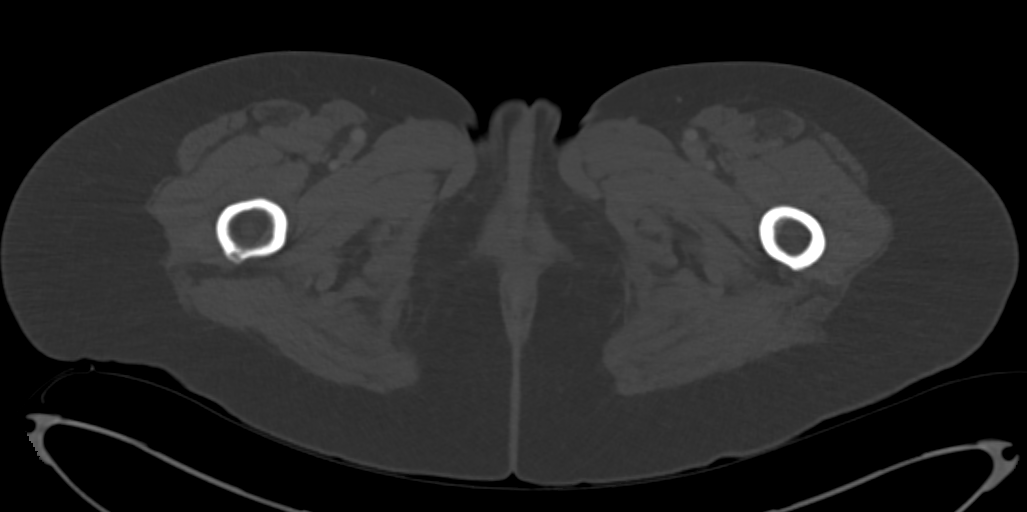
[im 15/57  soft-tissue]
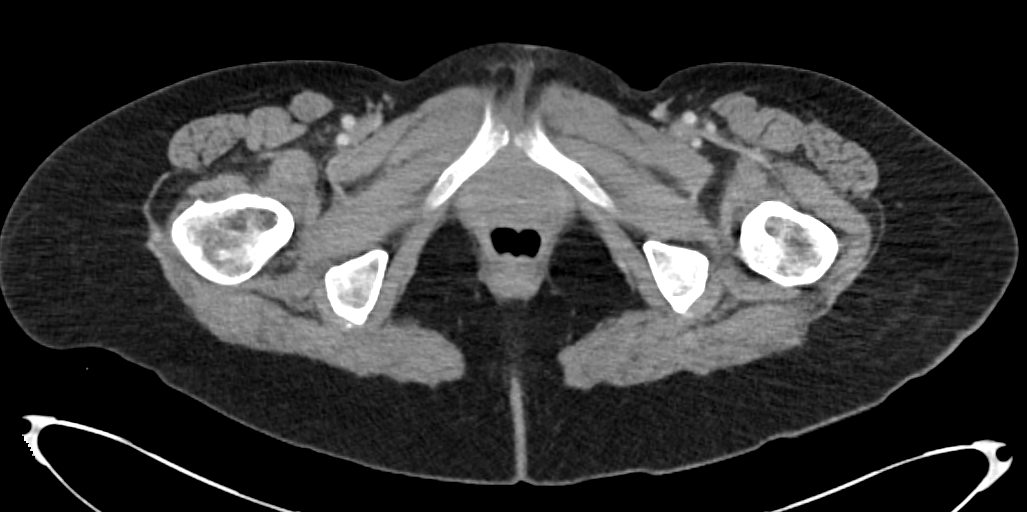
[im 23/57  soft-tissue]
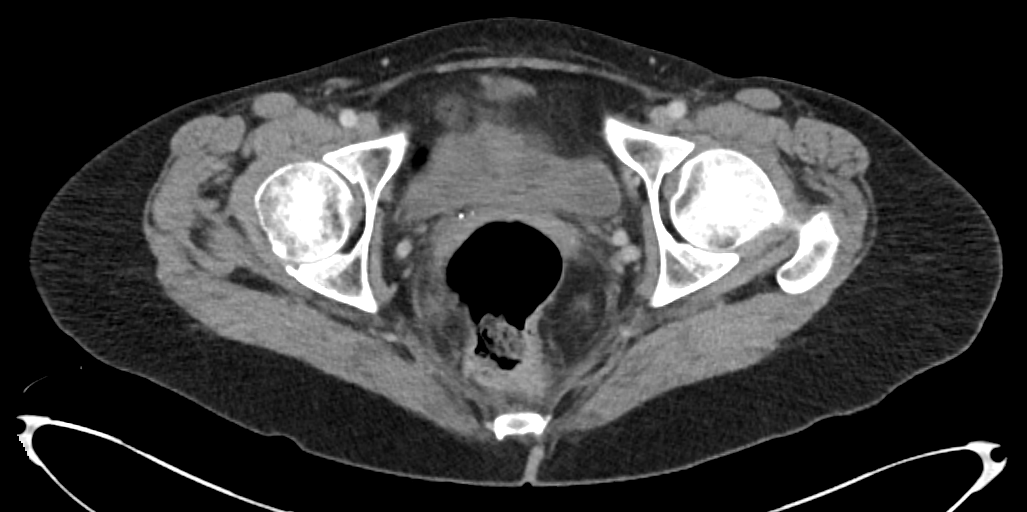
[im 34/57  soft-tissue]
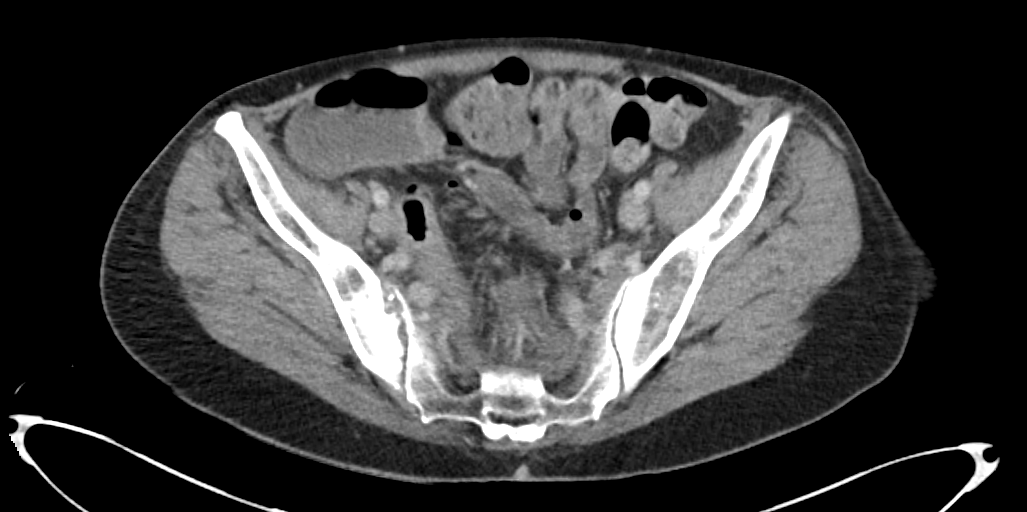
[im 42/57  soft-tissue]
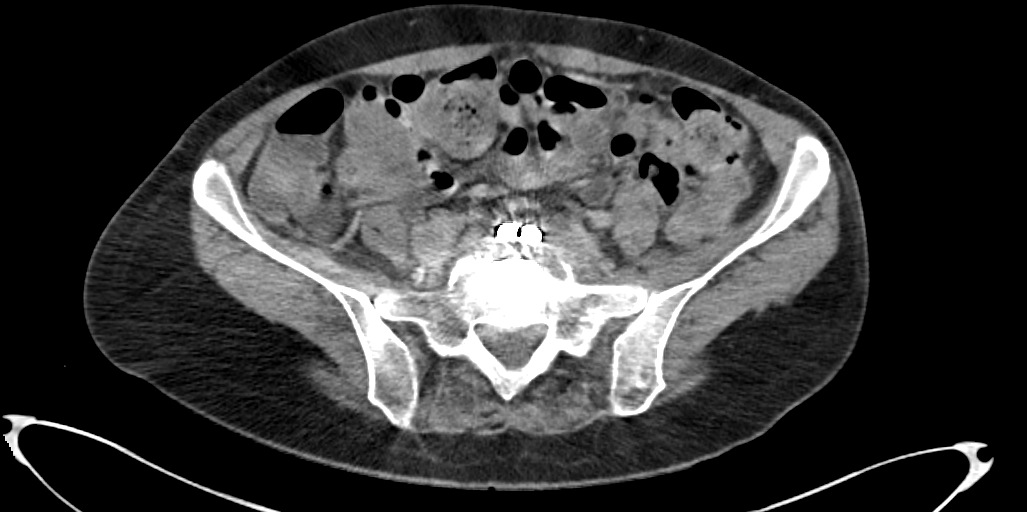
[im 49/57  soft-tissue]
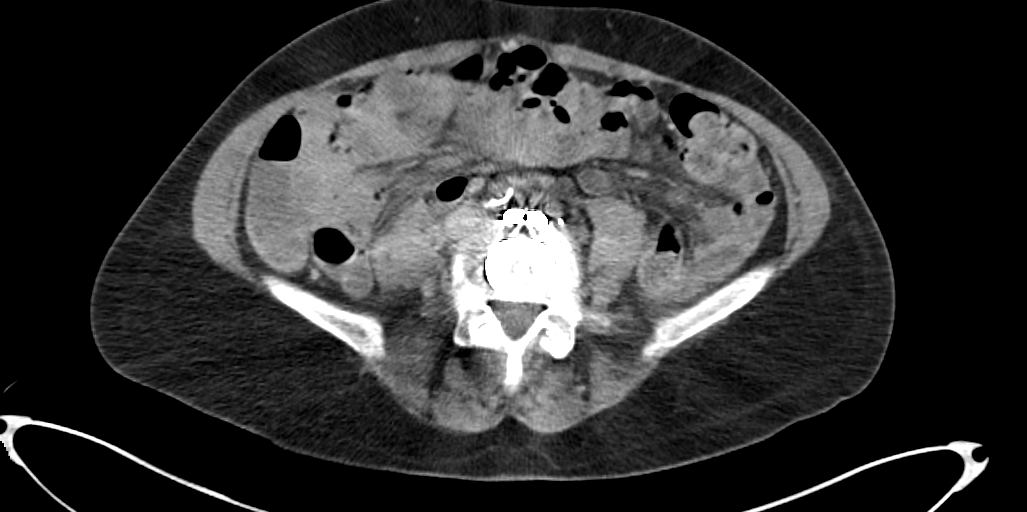

[Series 4: pelvis cor 5.00 br40 s3 (person_name) · coronal · 0.56mm/px · 3 of 46 slices shown]
[im 16/46  soft-tissue]
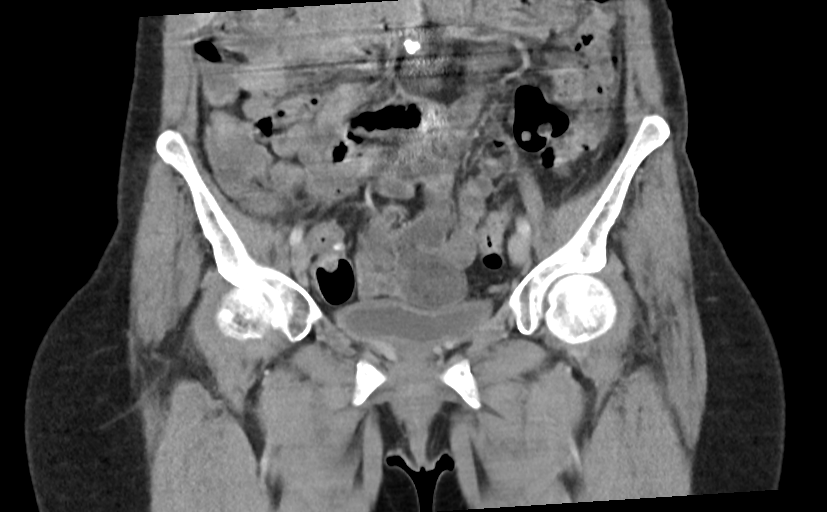
[im 21/46  soft-tissue]
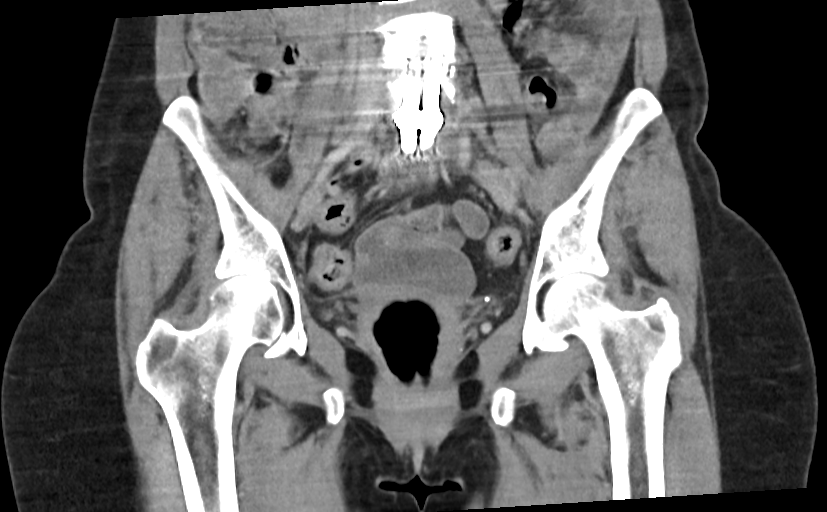
[im 26/46  soft-tissue]
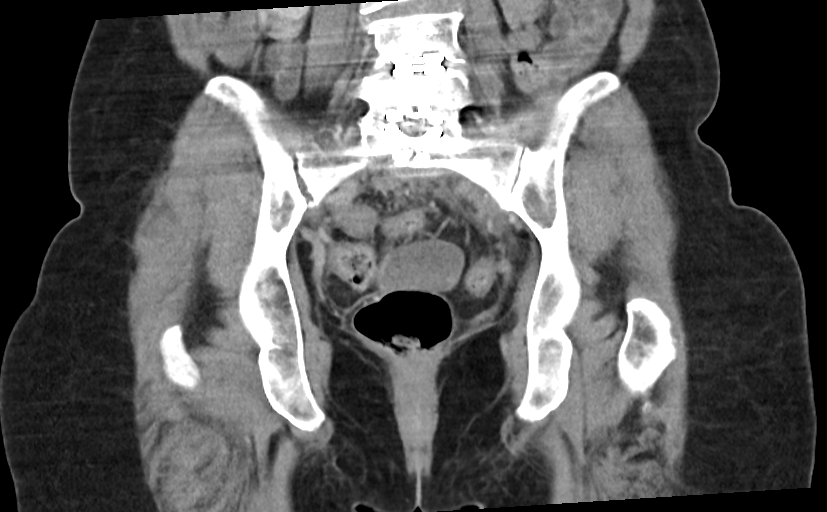

[9 of 46 positions shown; findings below may reference images not displayed]

EXAM

CT of the pelvis was performed with IV contrast

INDICATION

osteo?
lt hip pain that goes into sacrum for the last 10 days that is getting worse- hx of osteomyelitis
in rt hip a couple months ago- lower back surgery in 4445, [id number] 100ml via 22g in rt ante hand
injected creat. .68
GFR 77.9. ct/nm 2(..)

TECHNIQUE

All CT scans at this facility use dose modulation, iterative reconstruction, and/or weight based
dosing when appropriate to reduce radiation dose to as low as reasonably achievable.

CT of the pelvis was performed using 100 mL of Omnipaque 300.

# of CT scans in the past year: 0

# of Myocardial perfusion scans this past year: 0

COMPARISONS

None

FINDINGS

There is motion artifact that degrades image quality. There is no pelvic adenopathy identified. The
visualized bowel is unremarkable. There is evidence for small amount of air within a blood vessel in
the right pelvis which likely is related to recent catheterization in injection. There are
hysterectomy changes. There is no free fluid. There are postsurgical changes involving the
visualized lower lumbar spine with anterior plate fixation and disc spacers. No gross destructive
osseous lesion. The proximal femurs are unremarkable. No destructive osseous lesion is identified.

IMPRESSION
1. No evidence of fracture.
2. No acute abnormality within the visualized portions of the pelvis.
3. Postsurgical changes involving the visualized lower lumbar spine.

Tech Notes:

lt hip pain that goes into sacrum for the last 10 days that is getting worse- hx of osteomyelitis in
rt hip a couple months ago- lower back surgery in 4445, [id number] 100ml via 22g in rt ante hand
injected creat. .68 GFR 77.9. ct/nm 2/0

## 2021-11-11 ENCOUNTER — Encounter
Admit: 2021-11-11 | Discharge: 2021-11-11 | Payer: MEDICARE | Primary: Student in an Organized Health Care Education/Training Program

## 2021-12-02 IMAGING — MR Pelvis^ROUTINE
5 of 9 series · 23 of 48 positions shown · IV contrast (with contrast)
Comparison: none

[Series 3: T2 fat-sat · axial · 8.0mm · 1.09mm/px · z∈[-132,+48]mm · 5 of 20 slices shown]
[im 1/20]
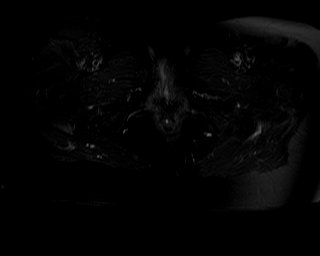
[im 5/20]
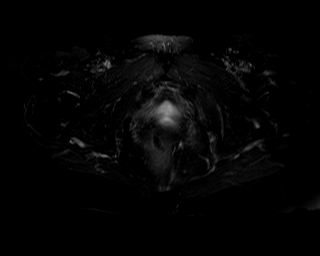
[im 10/20]
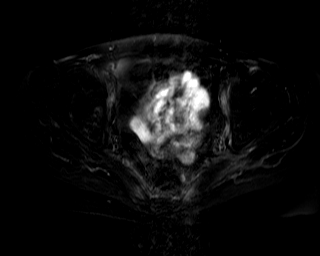
[im 15/20]
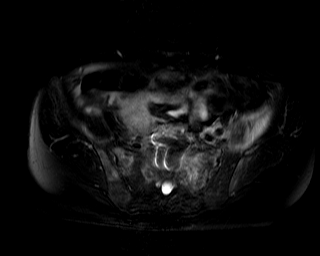
[im 20/20]
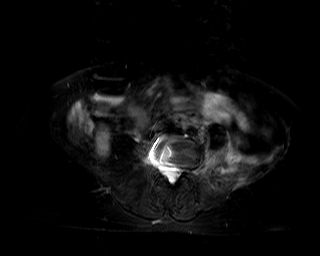

[Series 4: T1 · axial · 8.0mm · 0.68mm/px · z∈[-134,+47]mm · 4 of 18 slices shown (1 of 3)]
[im 1/18]
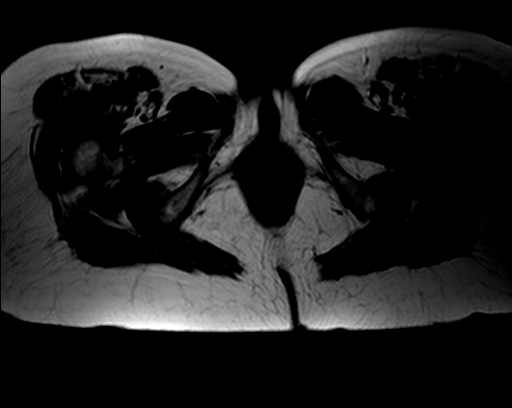
[im 6/18]
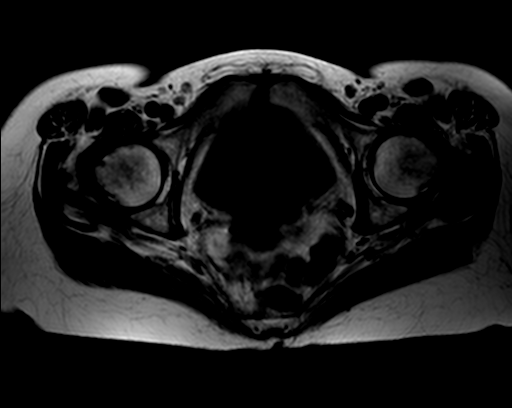
[im 12/18]
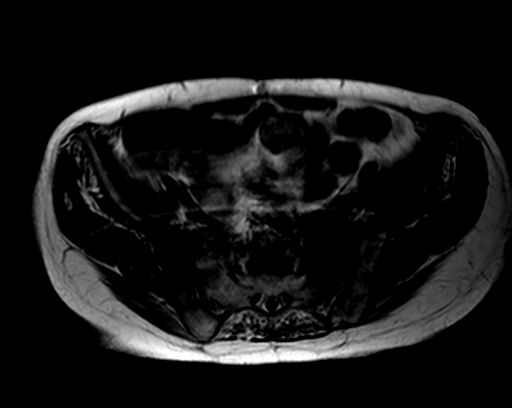
[im 18/18]
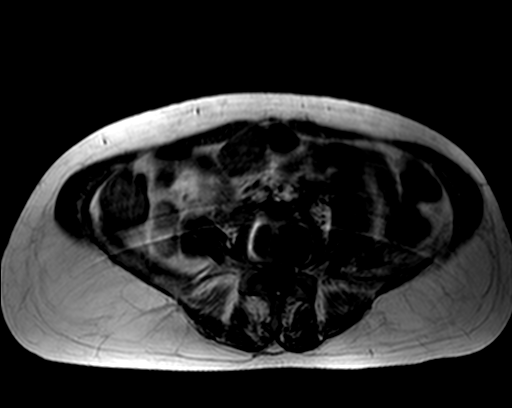

[Series 5: T1 · coronal · 8.0mm · 0.74mm/px · 6 of 20 slices shown (2 of 3)]
[im 1/20]
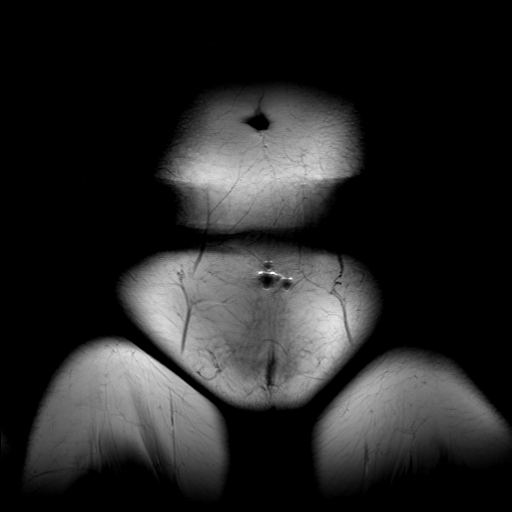
[im 4/20]
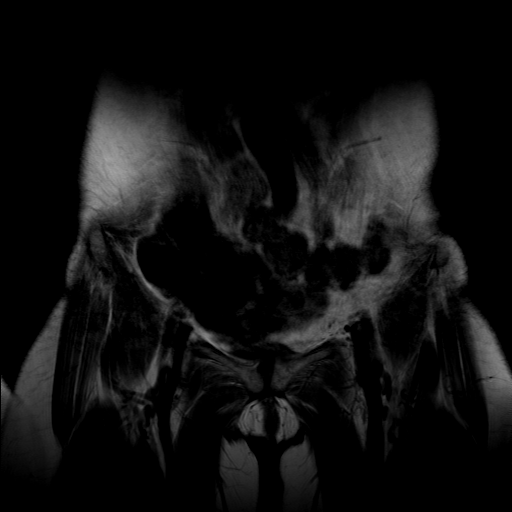
[im 8/20]
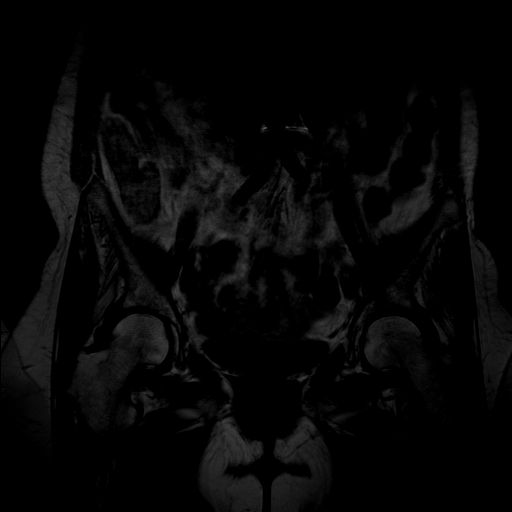
[im 12/20]
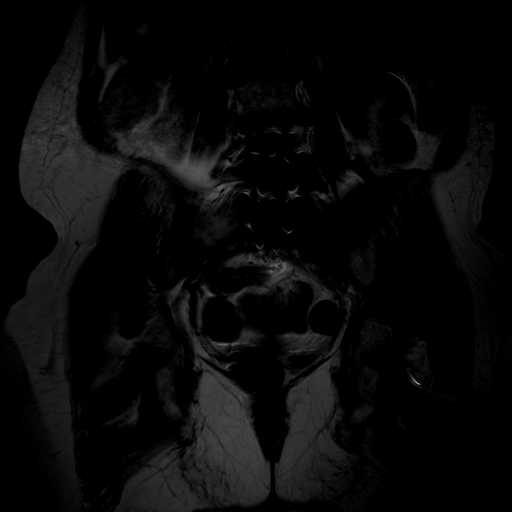
[im 16/20]
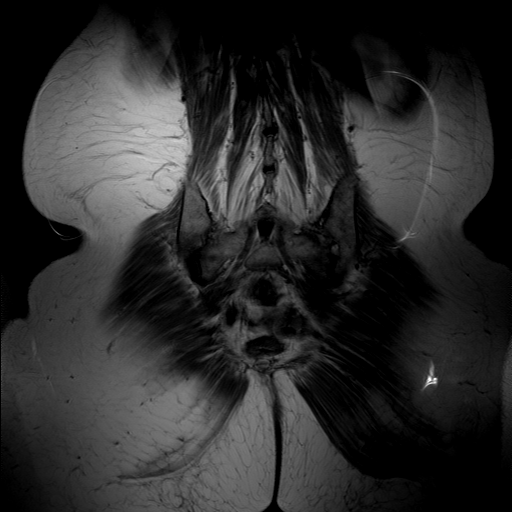
[im 20/20]
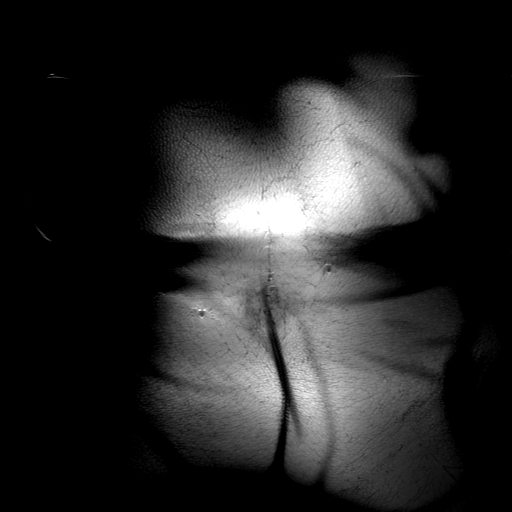

[Series 6: t1_tirm_cor · coronal · 8.0mm · 0.74mm/px · 5 of 18 slices shown]
[im 1/18]
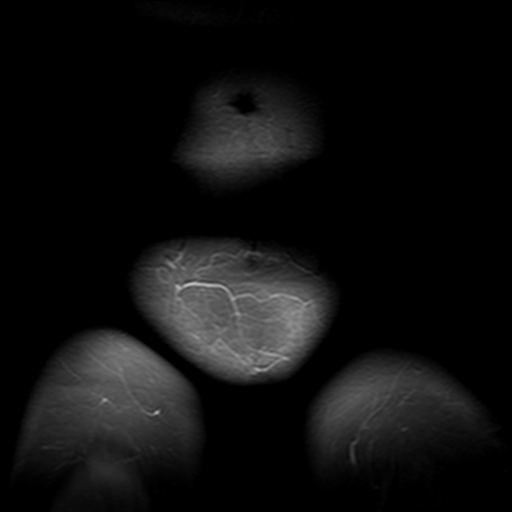
[im 5/18]
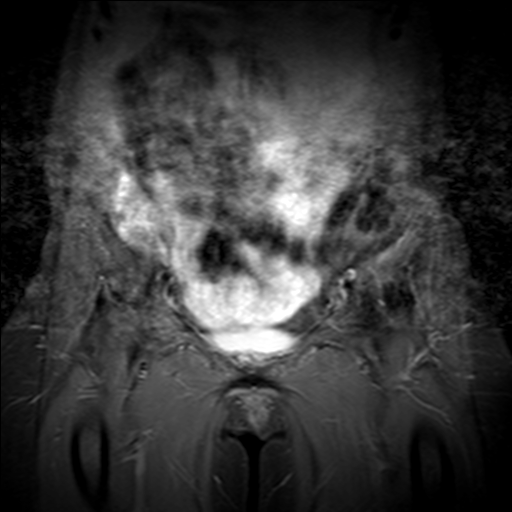
[im 9/18]
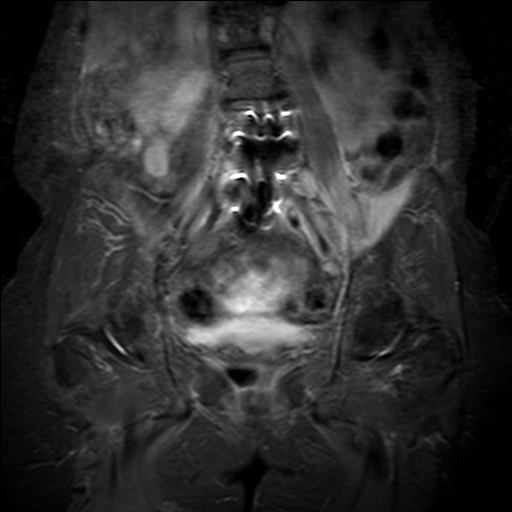
[im 13/18]
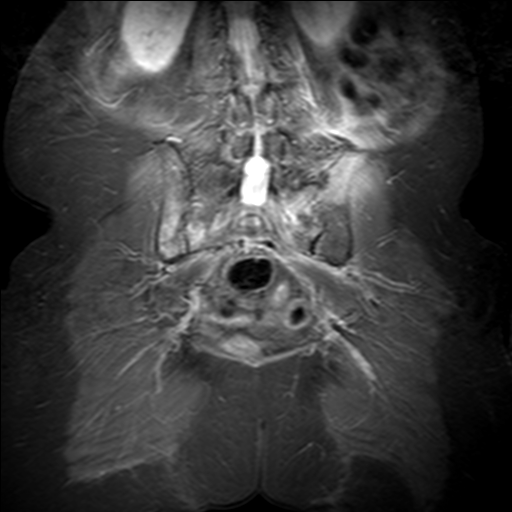
[im 18/18]
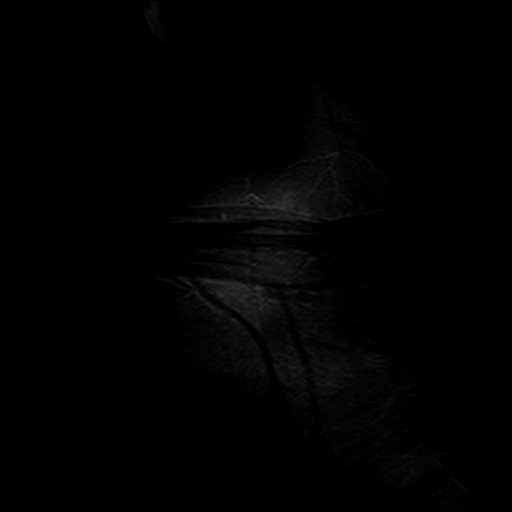

[Series 7: T1 · sagittal · 8.0mm · 0.68mm/px · 3 of 18 slices shown (3 of 3)]
[im 1/18]
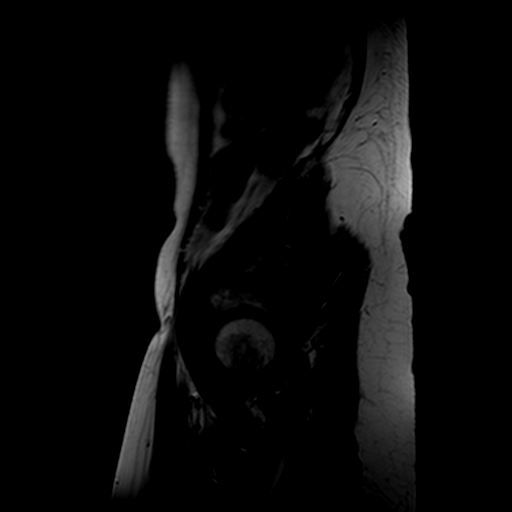
[im 5/18]
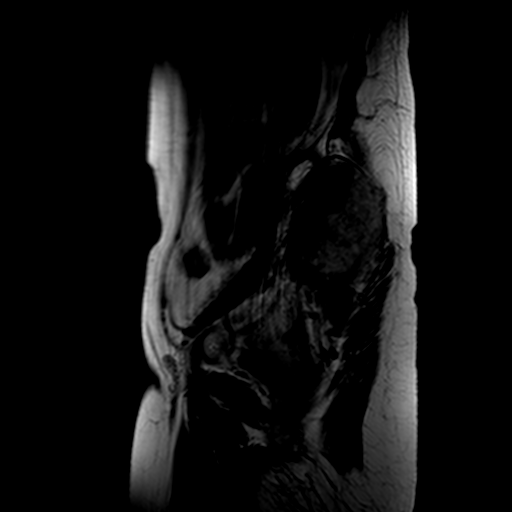
[im 9/18]
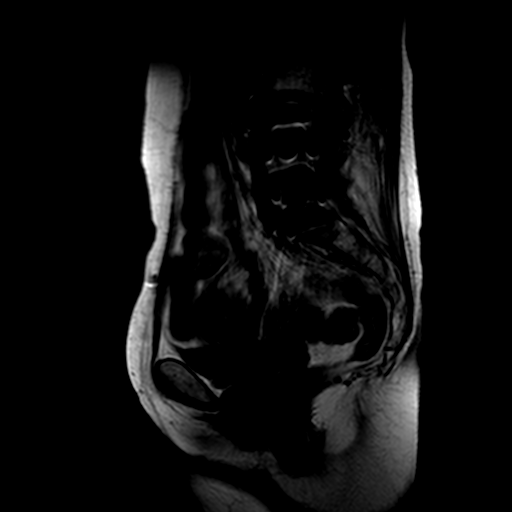

[23 of 48 positions shown; findings below may reference images not displayed]

DIAGNOSTIC STUDIES

EXAM

MRI pelvis with without contrast

INDICATION

possible infected hip
LEFT HIP PAIN IN TO BUTTOCK, RECENT OSTEO TO SACRUM, DID HAVE FLUID ON RT HIP, AND NOW ON LEFT HIP.
15 ML GADAVIST.  RG

TECHNIQUE

Sagittal axial coronal images were obtained with variable T1 and T2 weighting before after
administration of intravenous contrast.

COMPARISONS

September 05, 2021

FINDINGS

There is extensive edema and cortical destruction involving the left sacral ala and to a lesser
degree the left ilium for instance image 5 series 4 and image 3 series 4. Diffuse enhancement is
seen after the administration of gadolinium. There is edema and enhancement of the left iliacus
muscle and gluteal musculature without drainable organized fluid collection. The leading
consideration is osteomyelitis/myositis. Neoplasm is remote possibility and. Appropriate laboratory
studies are recommended.

Residual edema enhancement can be seen in the right sacroiliac joint consistent with previously
treated osteomyelitis.

Postop changes lumbar spine are partially imaged.

IMPRESSION

Findings most consistent with osteomyelitis and myositis of the left sacroiliac joint as described
without drainable fluid collection. Additional considerations are outlined above.

Residual edema enhancement from previously treated osteomyelitis of the right sacroiliac joint.

Case and findings discussed with Dr. Abu Nayem  at [DATE] p.m..

Tech Notes:

LEFT HIP PAIN IN TO BUTTOCK, RECENT OSTEO TO SACRUM, DID HAVE FLUID ON RT HIP, AND NOW ON LEFT HIP.
15 ML GADAVIST.  RG

## 2022-02-26 ENCOUNTER — Encounter
Admit: 2022-02-26 | Discharge: 2022-02-26 | Payer: MEDICARE | Primary: Student in an Organized Health Care Education/Training Program

## 2022-06-19 IMAGING — MR SPCERVWO
6 of 9 series · 27 of 48 positions shown · non-contrast
Comparison: none

[Series 5: T2 · sagittal · 3.0mm · 0.69mm/px · 3 of 17 slices shown (1 of 2)]
[im 1/17]
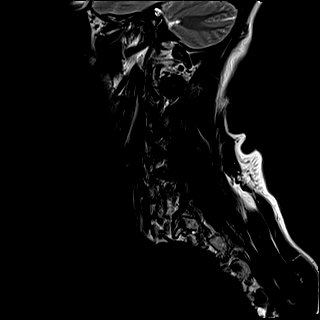
[im 9/17]
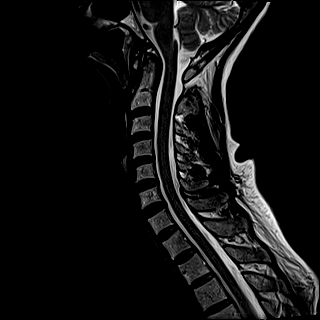
[im 17/17]
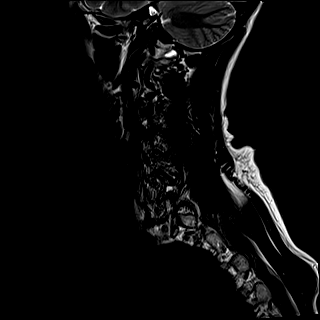

[Series 6: T1 · sagittal · 3.0mm · 0.43mm/px · 3 of 17 slices shown (1 of 2)]
[im 1/17]
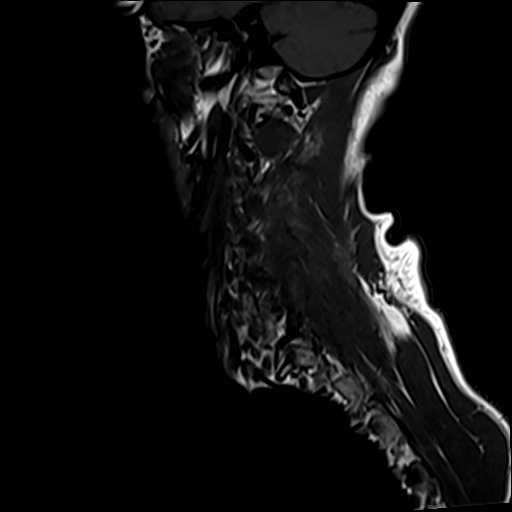
[im 9/17]
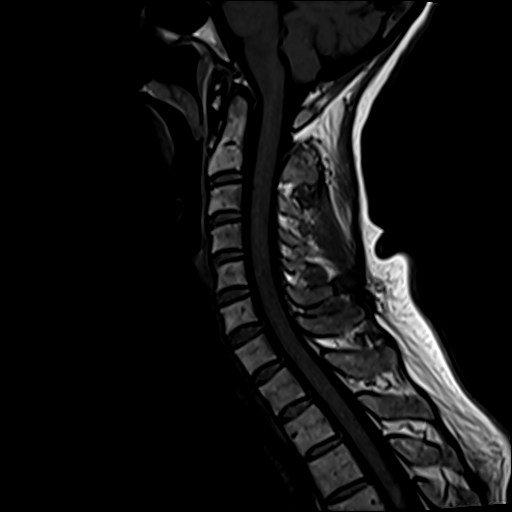
[im 17/17]
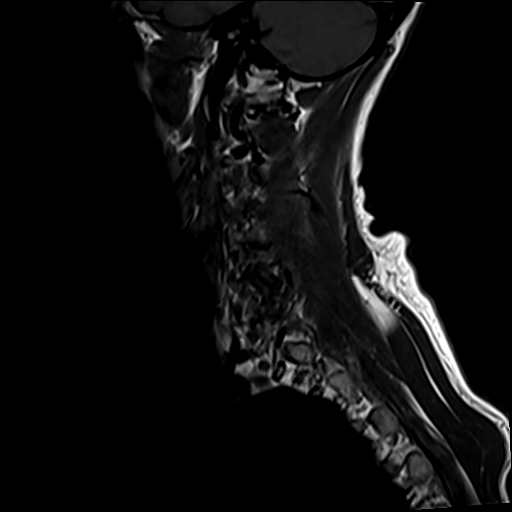

[Series 7: STIR · sagittal · 3.0mm · 0.86mm/px · 3 of 17 slices shown]
[im 1/17]
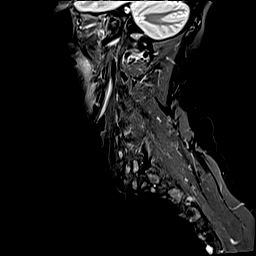
[im 9/17]
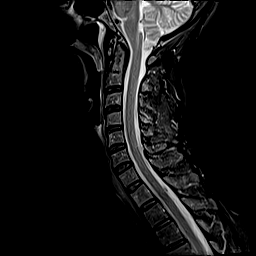
[im 17/17]
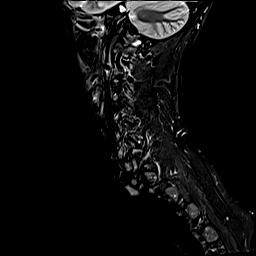

[Series 8: T2 · axial · 3.0mm · 0.70mm/px · z∈[-142,-33]mm · 6 of 35 slices shown (2 of 2)]
[im 1/35]
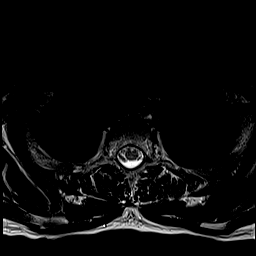
[im 7/35]
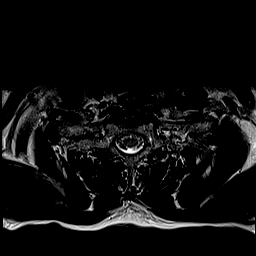
[im 14/35]
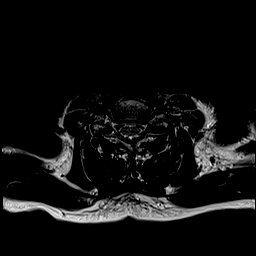
[im 21/35]
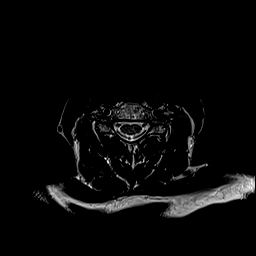
[im 28/35]
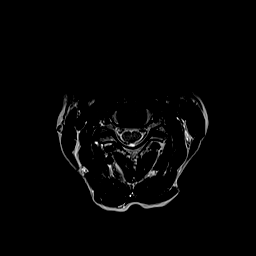
[im 35/35]
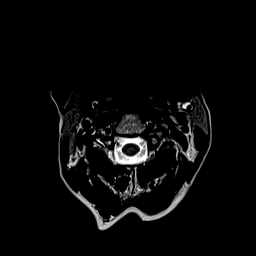

[Series 9: GRE · axial · 3.0mm · 0.47mm/px · z∈[-142,-33]mm · 6 of 35 slices shown]
[im 1/35]
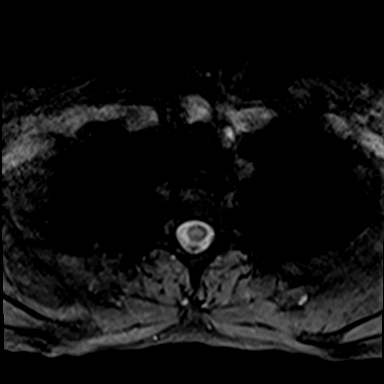
[im 7/35]
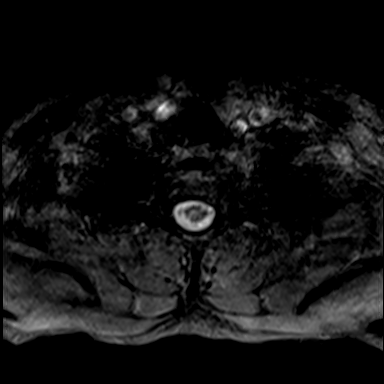
[im 14/35]
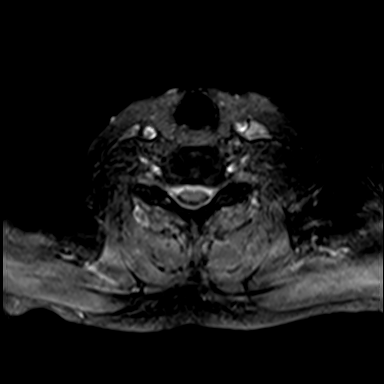
[im 21/35]
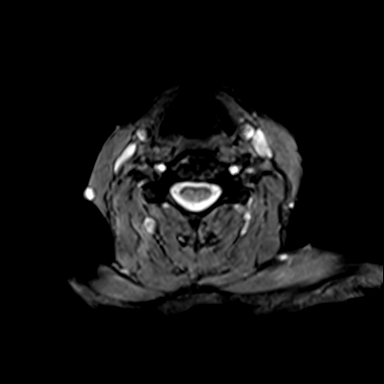
[im 28/35]
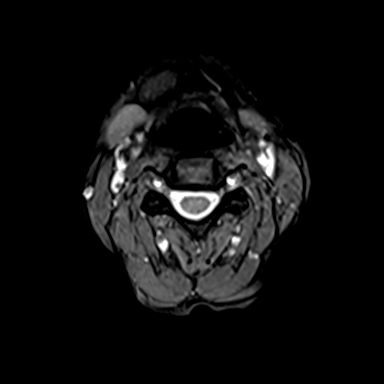
[im 35/35]
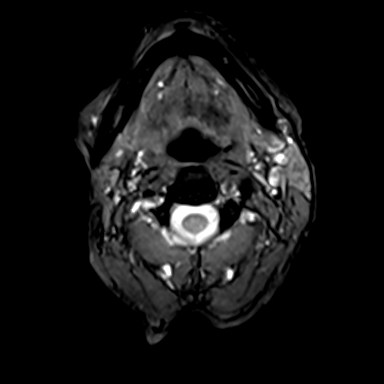

[Series 10: T1 · axial · 3.0mm · 0.35mm/px · z∈[-142,-33]mm · 6 of 34 slices shown (2 of 2)]
[im 1/34]
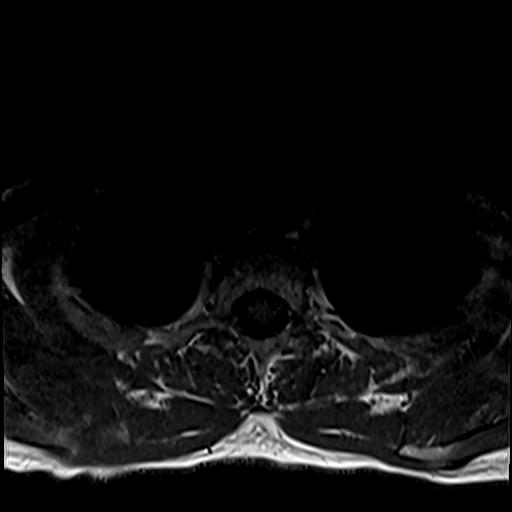
[im 7/34]
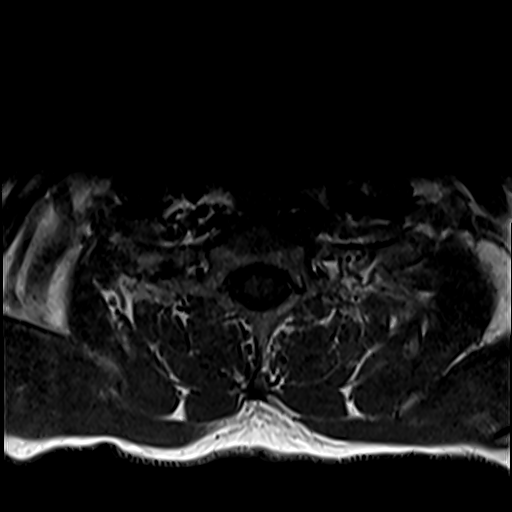
[im 14/34]
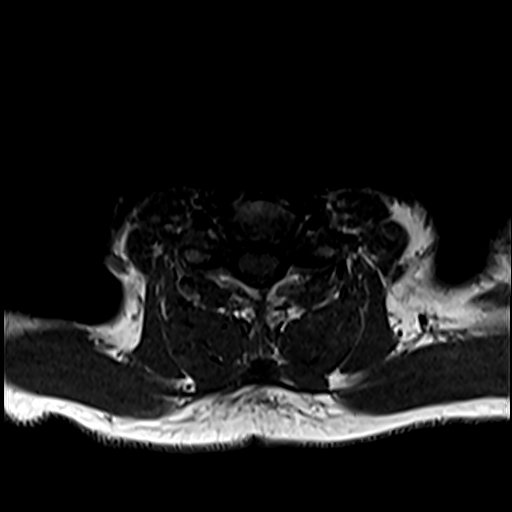
[im 20/34]
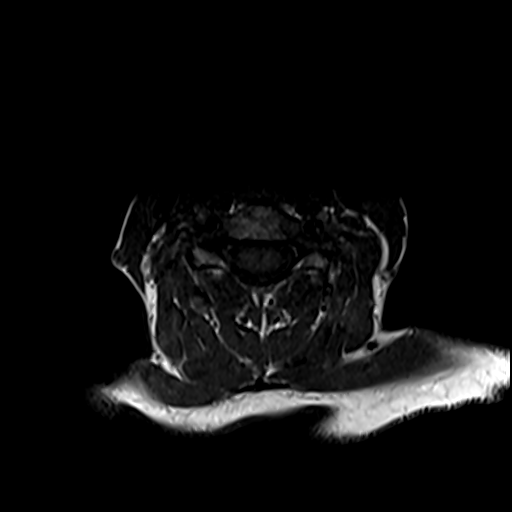
[im 27/34]
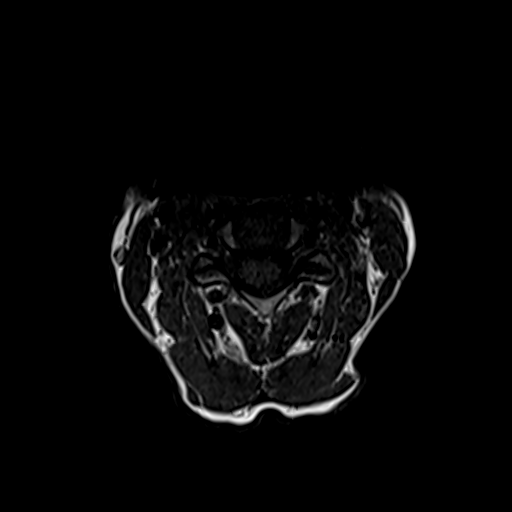
[im 34/34]
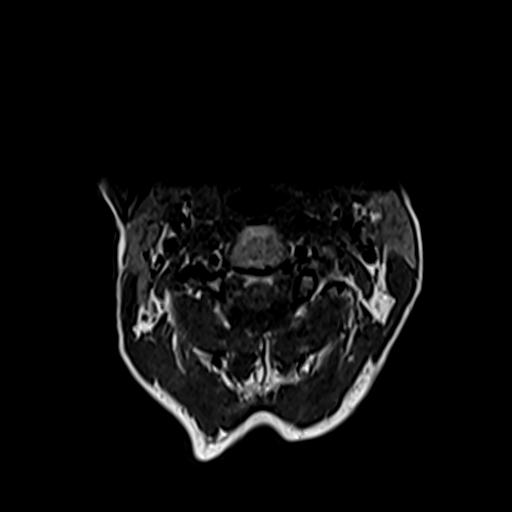

[27 of 48 positions shown; findings below may reference images not displayed]

DIAGNOSTIC STUDIES

EXAM

MR cervical spine wo con

INDICATION

neck pain
CHRONIC BACK PAIN WITH HX FUSION L4-S1, RADIATION TO HIPS, HX OSTEO TO HIP.  RG

TECHNIQUE

Sagittal and axial images were obtained with variable T1 and T2 weighting.

COMPARISONS

None available

FINDINGS

Normal signal properties are noted throughout the visualized brainstem, cervical cord, and
visualized thoracic spinal cord.

Cervical vertebral bodies demonstrate normal height, alignment, and marrow signal properties.

There is no evidence for disc herniation, central canal stenosis, or neural foraminal stenosis
throughout the cervical spine.

IMPRESSION

Normal MRI of the cervical spine.

Tech Notes:

CHRONIC BACK PAIN WITH HX FUSION L4-S1, RADIATION TO HIPS, HX OSTEO TO HIP.  RG

## 2022-06-19 IMAGING — MR SPLUMBWO
9 of 12 series · 28 of 48 positions shown · non-contrast
Comparison: none

[Series 5: T2 · sagittal · 4.0mm · 0.68mm/px · 2 of 17 slices shown (1 of 4)]
[im 1/17]
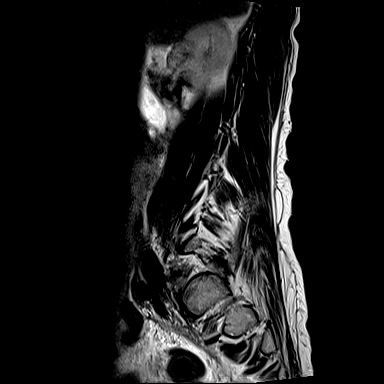
[im 17/17]
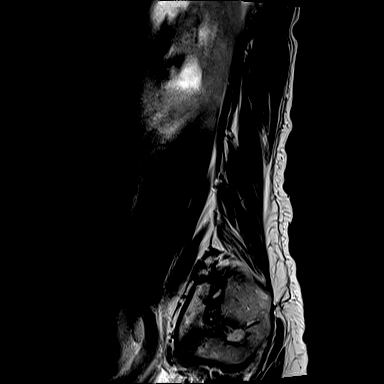

[Series 6: T1 · sagittal · 4.0mm · 0.88mm/px · 2 of 17 slices shown (1 of 4)]
[im 1/17]
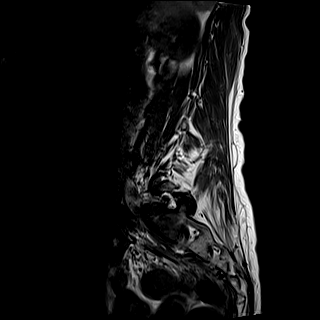
[im 17/17]
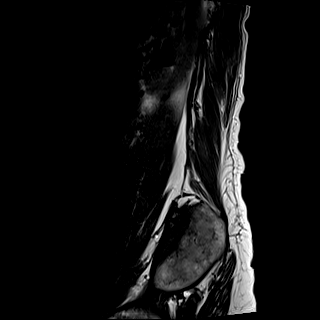

[Series 7: STIR · sagittal · 4.0mm · 0.51mm/px · 2 of 17 slices shown]
[im 1/17]
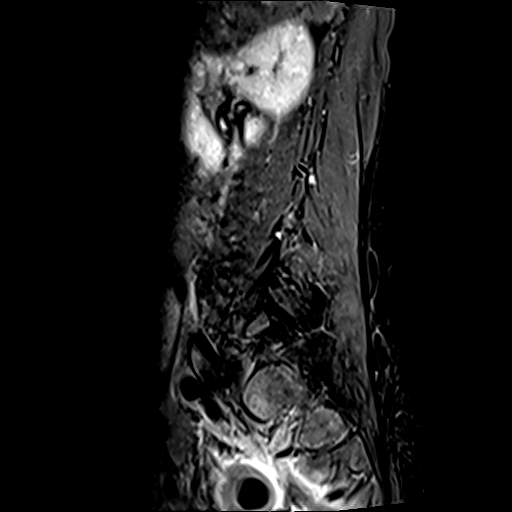
[im 17/17]
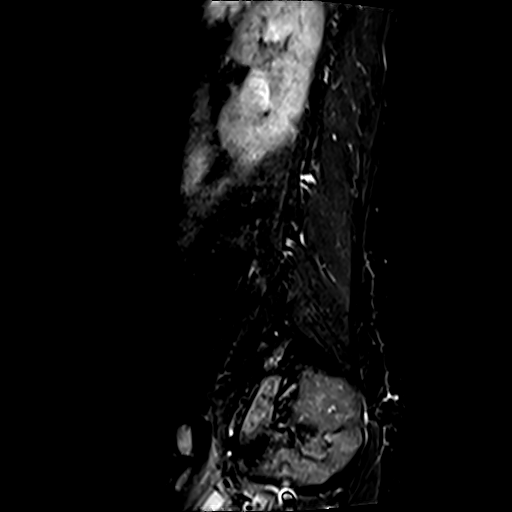

[Series 8: T2 · axial · 4.5mm · 0.81mm/px · z∈[-61,+62]mm · 3 of 24 slices shown (2 of 4)]
[im 1/24]
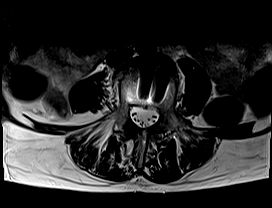
[im 12/24]
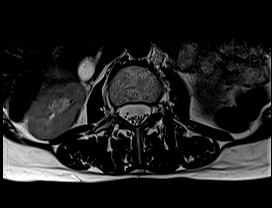
[im 24/24]
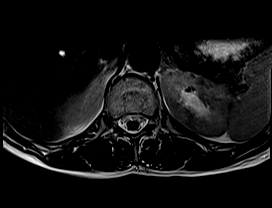

[Series 9: T2 · axial · 4.5mm · 0.81mm/px · z∈[-178,-96]mm · 2 of 18 slices shown (3 of 4)]
[im 1/18]
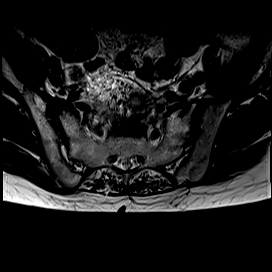
[im 18/18]
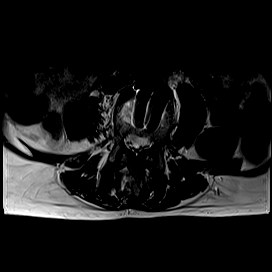

[Series 10: T2 · axial · 4.5mm · 0.81mm/px · z∈[-178,+62]mm · 6 of 40 slices shown (4 of 4)]
[im 1/40]
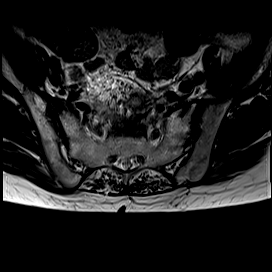
[im 8/40]
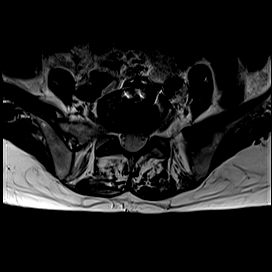
[im 16/40]
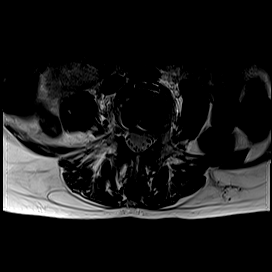
[im 24/40]
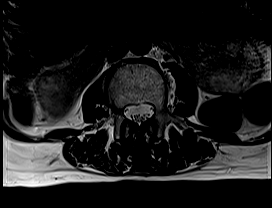
[im 32/40]
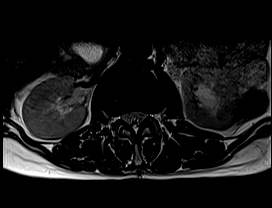
[im 40/40]
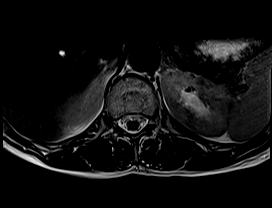

[Series 11: T1 · axial · 4.5mm · 0.43mm/px · z∈[-62,+61]mm · 3 of 24 slices shown (2 of 4)]
[im 1/24]
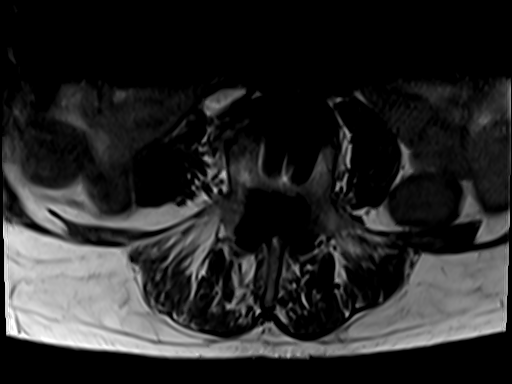
[im 12/24]
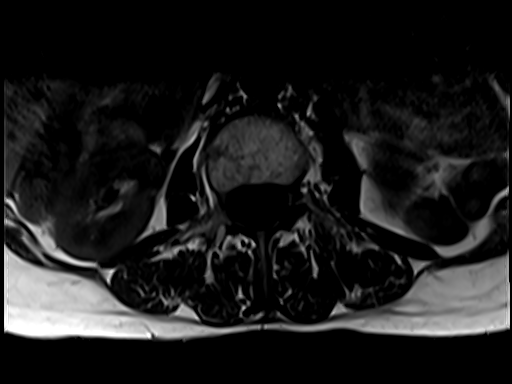
[im 24/24]
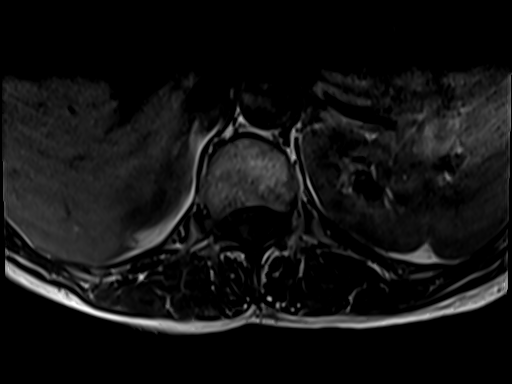

[Series 12: T1 · axial · 4.5mm · 0.43mm/px · z∈[-178,-96]mm · 2 of 18 slices shown (3 of 4)]
[im 1/18]
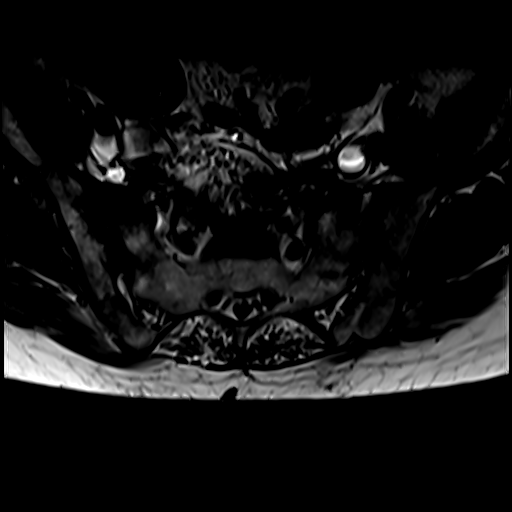
[im 18/18]
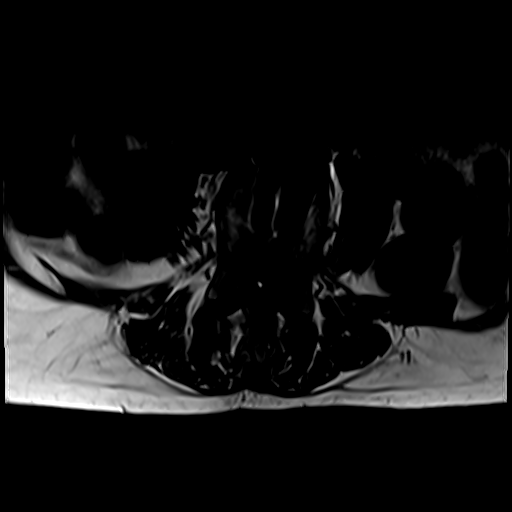

[Series 13: T1 · axial · 4.5mm · 0.43mm/px · z∈[-178,+61]mm · 6 of 40 slices shown (4 of 4)]
[im 1/40]
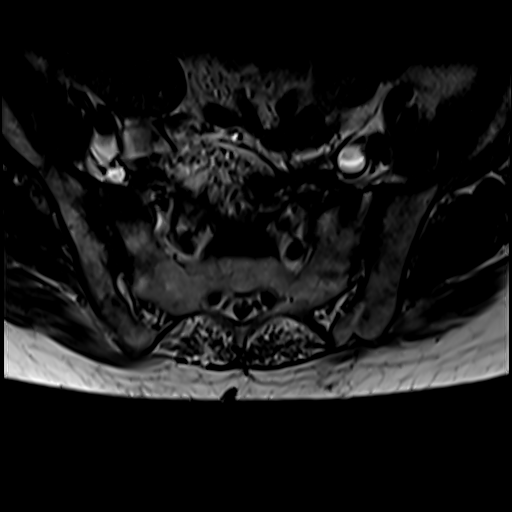
[im 8/40]
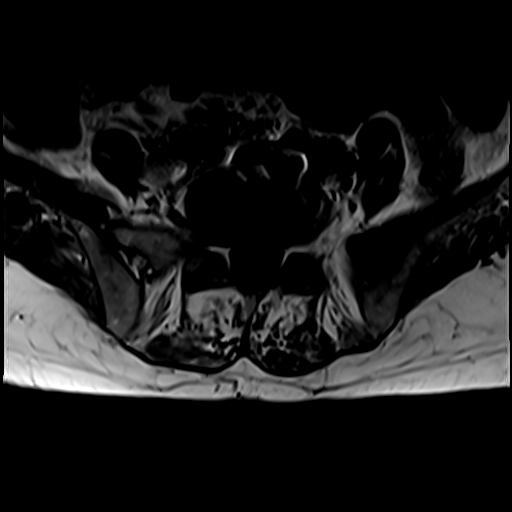
[im 16/40]
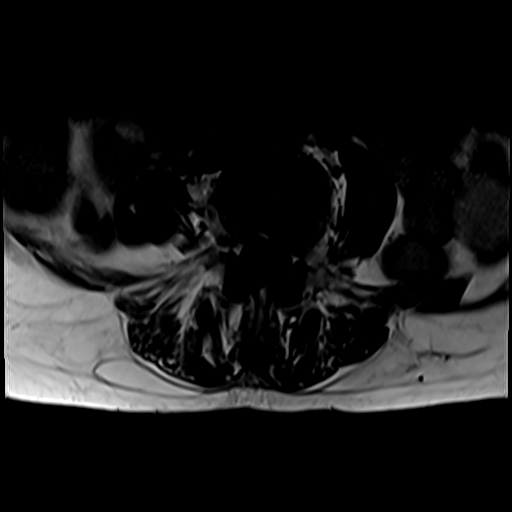
[im 24/40]
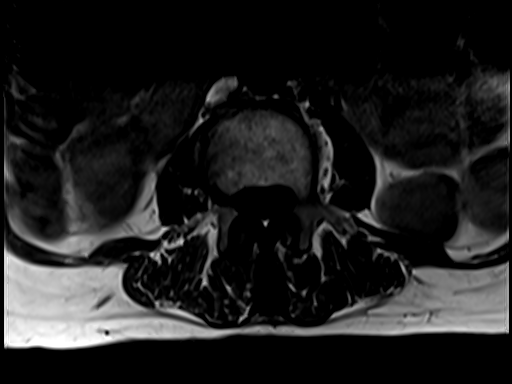
[im 32/40]
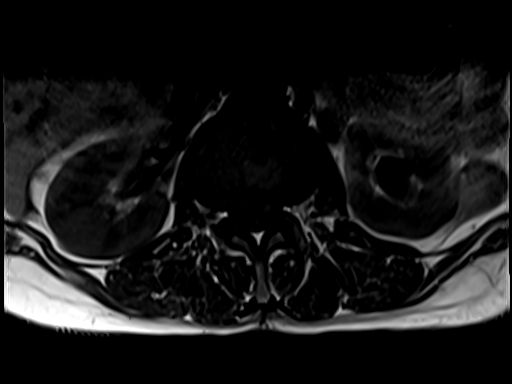
[im 40/40]
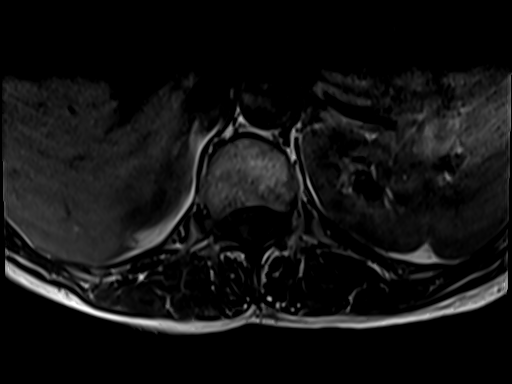

[28 of 48 positions shown; findings below may reference images not displayed]

DIAGNOSTIC STUDIES

EXAM

MR lumbar spine wo con

INDICATION

back pain
CHRONIC BACK PAIN WITH HX FUSION L4-S1, RADIATION TO HIPS, HX OSTEO TO HIP.  RG

TECHNIQUE

Sagittal axial images were obtained with variable T1 and T2 weighting.

COMPARISONS

None available

FINDINGS

Conus medullaris is normal in signal intensity and location. The T12-L1 disc is normal.

L1-2: Slight disc bulging is noted with facet hypertrophy without significant central canal or
neural foraminal stenosis.

L2-3: There is disc bulging and facet hypertrophy. There is mild central canal narrowing and mild
left neural foraminal stenosis.

L3-4: Disc bulging and facet hypertrophy is evident. There is minimal central canal narrowing and
minimal bilateral neural foraminal stenosis.

L4-5: Patient is status post anterior fixation and fusion at this level with intervertebral disc
device in place. No central canal narrowing is seen. Minimal neural foraminal stenosis is noted
bilaterally.

L5-S1: Patient is also status post anterior fixation with intervertebral disc device placement at
this level. There is facet hypertrophy resulting in minimal neural foraminal stenosis bilaterally.
No central canal narrowing is seen.

IMPRESSION

Postop changes at L4-5 and L5-S1 with minimal bilateral neural foraminal stenosis at these levels.

Minimal central canal narrowing and minimal left neural foraminal stenosis at L2-3.

Minimal central canal narrowing and minimal bilateral neural foraminal stenosis L3-4.

Tech Notes:

CHRONIC BACK PAIN WITH HX FUSION L4-S1, RADIATION TO HIPS, HX OSTEO TO HIP.  RG

## 2022-08-04 ENCOUNTER — Encounter
Admit: 2022-08-04 | Discharge: 2022-08-04 | Payer: MEDICARE | Primary: Student in an Organized Health Care Education/Training Program

## 2022-09-04 ENCOUNTER — Encounter
Admit: 2022-09-04 | Discharge: 2022-09-04 | Payer: MEDICARE | Primary: Student in an Organized Health Care Education/Training Program

## 2022-09-05 ENCOUNTER — Encounter
Admit: 2022-09-05 | Discharge: 2022-09-05 | Payer: MEDICARE | Primary: Student in an Organized Health Care Education/Training Program

## 2022-09-08 ENCOUNTER — Encounter
Admit: 2022-09-08 | Discharge: 2022-09-08 | Payer: MEDICARE | Primary: Student in an Organized Health Care Education/Training Program

## 2022-09-08 DIAGNOSIS — M545 Low back pain, unspecified back pain laterality, unspecified chronicity, unspecified whether sciatica present: Secondary | ICD-10-CM

## 2022-09-08 DIAGNOSIS — M546 Pain in thoracic spine: Secondary | ICD-10-CM

## 2022-09-09 NOTE — Patient Instructions
It was nice to see you today.  Thank you for choosing to visit our clinic.  Your time is important, and if you had to wait today, we do apologize.  Our goal is to run exactly on time.  However, on occasion, we get behind in clinic due to unexpected patient issues.  Thank you for your patience.    General Instructions:  Scheduling:  Our scheduling phone number is [phone].  Appointment Reminders on your cell phone:  Communication preferences can be managed in MyChart to ensure you receive important appointment notifications  How to reach our office:  Please send a MyChart message to the [facility] (directed to Dr. Davis) or leave a voicemail for the nurse, Gwyneth Fernandez, at [phone].  How to get a medication refill:  Please use the MyChart Refill request or contact your pharmacy directly to request medication refills.  Please allow 72 business hours for request to be completed.    Support for many chronic illnesses is available through [facility] at [email or web address] or [phone].    For help with MyChart:  please call [phone].    For questions on nights, weekends or holidays:  call the Operator at [phone], and ask for the doctor on call for Neurosurgery.    For more information on spinal conditions:  please visit [email or web address]     Again, thank you for coming in today.

## 2022-09-10 ENCOUNTER — Ambulatory Visit
Admit: 2022-09-10 | Discharge: 2022-09-10 | Payer: MEDICARE | Primary: Student in an Organized Health Care Education/Training Program

## 2022-09-10 ENCOUNTER — Encounter
Admit: 2022-09-10 | Discharge: 2022-09-10 | Payer: MEDICARE | Primary: Student in an Organized Health Care Education/Training Program

## 2022-09-10 DIAGNOSIS — M4800 Spinal stenosis, site unspecified: Secondary | ICD-10-CM

## 2022-09-10 DIAGNOSIS — M545 Low back pain, unspecified back pain laterality, unspecified chronicity, unspecified whether sciatica present: Secondary | ICD-10-CM

## 2022-09-10 DIAGNOSIS — IMO0002 Ulcer: Secondary | ICD-10-CM

## 2022-09-10 DIAGNOSIS — R519 Generalized headaches: Secondary | ICD-10-CM

## 2022-09-10 DIAGNOSIS — M2550 Pain in unspecified joint: Secondary | ICD-10-CM

## 2022-09-10 DIAGNOSIS — M5136 Other intervertebral disc degeneration, lumbar region: Secondary | ICD-10-CM

## 2022-09-10 DIAGNOSIS — K5900 Constipation, unspecified: Secondary | ICD-10-CM

## 2022-09-10 DIAGNOSIS — Z981 Arthrodesis status: Secondary | ICD-10-CM

## 2022-09-10 DIAGNOSIS — M479 Spondylosis, unspecified: Secondary | ICD-10-CM

## 2022-09-10 DIAGNOSIS — M546 Pain in thoracic spine: Secondary | ICD-10-CM

## 2022-09-10 DIAGNOSIS — J439 Emphysema, unspecified: Secondary | ICD-10-CM

## 2022-09-10 DIAGNOSIS — J449 Chronic obstructive pulmonary disease, unspecified: Secondary | ICD-10-CM

## 2022-09-10 DIAGNOSIS — F419 Anxiety disorder, unspecified: Secondary | ICD-10-CM

## 2022-09-10 DIAGNOSIS — J45909 Unspecified asthma, uncomplicated: Secondary | ICD-10-CM

## 2022-09-10 DIAGNOSIS — F32A Depression: Secondary | ICD-10-CM

## 2022-09-10 NOTE — Progress Notes
Date of Service: 09/10/2022        Chief complaint    Chief Complaint   Patient presents with   ? Lower Back - Pain   ? New Patient     Low back pain       HPI     Laura Walton is a 61 y.o. female new patient in today with complaint of chronic low back pain.  She has a history of a previous stand-alone ALIF type surgery L4-S1 done back around 2005 at [facility].  She tells me this did not really make much of a difference in terms of her pain at that time.  She notes over the years she has had back pain chronically that has waxed and waned.  She denies any radiating hip, buttock or lower extremity symptoms.  She specifically denies any numbness/tingling of the upper/lower extremities.  She complains of some subjective type weakness involving the legs on occasion.  She denies any fevers, shakes, sweats.  She has a history of osteomyelitis in the right SI region and was on extensive long-term IV antibiotic coverage managed by ID that ended earlier this year.  She has a history of IV drug abuse.  She notes her back pain radiates up and down her spine on occasion.  Also has known GU issues and has had surgery for this.  Her back pain worse with sitting, standing, walking, bending and reaching type activities.  It is improved with rest in addition to medications.     Short-Acting Medications  Oxycodone/Acetaminophen Dosage (mg): 5  # of Oxycodone/Acetaminophen tablets in 24 hours: 3         MME Total Score  MME Score: 22.5     Oswestry: Oswestry Total Score:: 50       PMH    Medical History:   Diagnosis Date   ? Anxiety    ? Asthma    ? Constipation    ? COPD (chronic obstructive pulmonary disease) (HCC)    ? Degenerative disc disease, lumbar    ? Depression    ? Generalized headaches    ? Joint pain    ? Pulmonary emphysema (HCC)    ? Spinal stenosis    ? Ulcer            ROS  Review of Systems   Musculoskeletal: Positive for back pain.   All other systems reviewed and are negative. FH    Family History   Problem Relation Age of Onset   ? Cancer Mother    ? Diabetes Father    ? Hypertension Father    ? Stroke Father    ? Osteoporosis Father    ? Back pain Father    ? Arthritis Father    ? Joint Pain Father      Social History     Socioeconomic History   ? Marital status: Divorced   Tobacco Use   ? Smoking status: Every Day     Packs/day: 1.00     Years: 45.00     Additional pack years: 0.00     Total pack years: 45.00     Types: Cigarettes   ? Smokeless tobacco: Never   Substance and Sexual Activity   ? Alcohol use: Not Currently   ? Drug use: Yes     Types: Methamphetamines, Heroin   ? Sexual activity: Not Currently           [facility]    Surgical History:  Procedure Laterality Date   ? HX FUSION PROCEDURE     ? HX HYSTERECTOMY         Meds    ? amLODIPine (NORVASC) 5 mg tablet Take two tablets by mouth.   ? duloxetine DR (CYMBALTA) 60 mg capsule Take one capsule by mouth daily.   ? ibuprofen (MOTRIN) 800 mg tablet Take one tablet by mouth three times daily as needed.   ? oxyCODONE (ROXICODONE) 5 mg tablet TAKE 2 TABLETS BY MOUTH EVERY 4 TO 6 HOURS AS NEEDED FOR PAIN   ? venlafaxine XR (EFFEXOR XR) 150 mg capsule TAKE 1 CAPSULE BY MOUTH AT BEDTIME FOR ANXIETY       Allergies    Allergies   Allergen Reactions   ? Sulfa (Sulfonamide Antibiotics) HIVES and RASH     Lip swelling  Lip swelling           Exam  Physical Exam   Constitutional: she is oriented to person, place, and time. she appears well-developed and well-nourished.    Head: Normocephalic and atraumatic.    Eyes: Conjunctivae and EOM are normal.    Pulmonary/Chest: Effort normal. No respiratory distress.   Neurological: she is alert and oriented to person, place, and time. No cranial nerve deficit or sensory deficit. she exhibits normal muscle tone. Coordination normal.   Skin: Skin is warm and dry.   Psychiatric: she has a normal mood and affect. her behavior is normal. Judgment and thought content normal.    Vitals reviewed.  The patient alert and oriented and in no acute distress.  Gait is antalgic.    Patient is able to tiptoe/heel walk with decent demonstration of power however must hold onto the exam table.  Lumbosacral region skin dry and intact with no palpable irregularities or muscle spasm.  Hip rotation free and painless bilaterally.  Seated straight leg raise negative bilaterally at 90 degrees, no root tension signs.    No calf tenderness or clonus.  Bilateral Hoffmann's not present.  Motor strength:   Bilateral upper/lower extremity motor strength 5 out of 5 in all groups.  Deep tendon reflexes:    2 to 2+ out of 4 in all groups bilaterally in regards to the upper/lower extremities.    Vitals:   Vitals:    09/10/22 0858   BP: (!) 141/87   Pulse: 73   SpO2: 100%   PainSc: Seven   Weight: 56.7 kg (125 lb)   Height: 165.1 cm (5' 5)     Body mass index is 20.8 kg/m?.      Imaging:   I independently reviewed the patient's imaging findings:  X-rays show no acute findings.  Implants appear tight/well-placed in the L4-S1 region anteriorly.  Does not have any significant junctional degenerative change.  She has a mild scoliosis noted mild spondylosis.  Reviewed her cervical, thoracic and lumbar MRI scans.  These do not exhibit any surgically significant stenosis and/or root compression.  He has mild stenosis above her prior fusion.  Bone scan from earlier this year also does not appear overly active in regards to the lumbar spine.  Does have increased activity in the SI regions.    .     Assessment/Plan    Impression:  Chronic low back pain, history of stand-alone L4-S1 ALIF back in 2005, lumbar spondylosis, history of right SI osteomyelitis and sees Ortho    Reviewed films/case with Dr. Earlene Plater today.  Reviewed films/pathology with the patient in detail.  Recommended continued follow-up  via her PCP in regards to her multiple ongoing medical issues.  Also recommend follow-up with orthopedics as scheduled.  Recommended continued conservative/nonoperative care from a spine standpoint.  She does not have any structural anomaly/instability, significant surgically significant stenosis and/or root compression.  Patient is currently neurologically intact and was instructed to contact us if this were to change.  Recommended considering referral to pain management for evaluation and treatment.  She may ultimately notice that it is a spinal cord stimulator trials as a last resort type option.  We also recommended some aggressive physical therapy/PMR as an option.  She plans on thinking about other above options and discussing this with her PCP if she would like to pursue somewhere closer to home with this.  We would be more than happy to refer her to for either here at [facility].  The patient is agreeable to the above treatment plan.  Several questions were answered and they were encouraged to contact us if symptoms changed/worsen or if they have any further questions.  Follow up as needed                                   Encounter Medications   Medications   ? oxyCODONE (ROXICODONE) 5 mg tablet     Sig: TAKE 2 TABLETS BY MOUTH EVERY 4 TO 6 HOURS AS NEEDED FOR PAIN   ? ibuprofen (MOTRIN) 800 mg tablet     Sig: Take one tablet by mouth three times daily as needed.   ? duloxetine DR (CYMBALTA) 60 mg capsule     Sig: Take one capsule by mouth daily.   ? amLODIPine (NORVASC) 5 mg tablet     Sig: Take two tablets by mouth.   ? venlafaxine XR (EFFEXOR XR) 150 mg capsule     Sig: TAKE 1 CAPSULE BY MOUTH AT BEDTIME FOR ANXIETY                              Please note that documentation of records were done during a busy neurosurgical clinic. Attempts have been made to review the document for any errors. Please excuse for brevity and typographical errors.

## 2022-10-09 ENCOUNTER — Encounter
Admit: 2022-10-09 | Discharge: 2022-10-09 | Payer: MEDICARE | Primary: Student in an Organized Health Care Education/Training Program

## 2022-10-09 DIAGNOSIS — Z981 Arthrodesis status: Secondary | ICD-10-CM

## 2022-10-09 DIAGNOSIS — M545 Low back pain, unspecified back pain laterality, unspecified chronicity, unspecified whether sciatica present: Secondary | ICD-10-CM

## 2022-10-09 DIAGNOSIS — M479 Spondylosis, unspecified: Secondary | ICD-10-CM

## 2022-10-21 ENCOUNTER — Encounter
Admit: 2022-10-21 | Discharge: 2022-10-21 | Payer: MEDICARE | Primary: Student in an Organized Health Care Education/Training Program

## 2022-10-21 NOTE — Telephone Encounter
Lvm to complete ppw prior to the appt . Also please bring any recent imaging regarding the pain. Advised Pt about new check-in process and to arrive an hour early .

## 2022-10-22 ENCOUNTER — Encounter
Admit: 2022-10-22 | Discharge: 2022-10-22 | Payer: MEDICARE | Primary: Student in an Organized Health Care Education/Training Program

## 2022-11-10 ENCOUNTER — Encounter
Admit: 2022-11-10 | Discharge: 2022-11-10 | Payer: MEDICARE | Primary: Student in an Organized Health Care Education/Training Program

## 2023-10-13 ENCOUNTER — Encounter
Admit: 2023-10-13 | Discharge: 2023-10-13 | Payer: MEDICARE | Primary: Student in an Organized Health Care Education/Training Program
